# Patient Record
Sex: Male | Born: 1960 | Race: White | Hispanic: No | Marital: Married | Smoking: Current every day smoker
Health system: Southern US, Community
[De-identification: ages and names within clinical notes are randomized; demographics above are authoritative.]

## PROBLEM LIST (undated history)

## (undated) DIAGNOSIS — I2510 Atherosclerotic heart disease of native coronary artery without angina pectoris: Secondary | ICD-10-CM

## (undated) DIAGNOSIS — I219 Acute myocardial infarction, unspecified: Secondary | ICD-10-CM

## (undated) DIAGNOSIS — C679 Malignant neoplasm of bladder, unspecified: Secondary | ICD-10-CM

## (undated) DIAGNOSIS — I10 Essential (primary) hypertension: Secondary | ICD-10-CM

## (undated) DIAGNOSIS — C419 Malignant neoplasm of bone and articular cartilage, unspecified: Secondary | ICD-10-CM

## (undated) DIAGNOSIS — C3490 Malignant neoplasm of unspecified part of unspecified bronchus or lung: Secondary | ICD-10-CM

## (undated) HISTORY — PX: OTHER SURGICAL HISTORY: SHX169

---
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
## 2007-10-10 DIAGNOSIS — I219 Acute myocardial infarction, unspecified: Secondary | ICD-10-CM

## 2007-10-10 HISTORY — DX: Acute myocardial infarction, unspecified: I21.9

## 2007-10-10 HISTORY — PX: CORONARY ARTERY BYPASS GRAFT: SHX141

## 2008-07-29 ENCOUNTER — Ambulatory Visit: Payer: Self-pay | Admitting: Cardiothoracic Surgery

## 2008-09-09 ENCOUNTER — Ambulatory Visit: Payer: Self-pay | Admitting: Thoracic Surgery (Cardiothoracic Vascular Surgery)

## 2014-12-11 ENCOUNTER — Other Ambulatory Visit: Payer: Self-pay | Admitting: Neurosurgery

## 2014-12-11 DIAGNOSIS — M5412 Radiculopathy, cervical region: Secondary | ICD-10-CM

## 2014-12-18 ENCOUNTER — Ambulatory Visit
Admission: RE | Admit: 2014-12-18 | Discharge: 2014-12-18 | Disposition: A | Discharge status: Home / Self Care | Payer: Medicare HMO | Source: Ambulatory Visit | Attending: Neurosurgery | Admitting: Neurosurgery

## 2014-12-18 DIAGNOSIS — M5412 Radiculopathy, cervical region: Secondary | ICD-10-CM

## 2014-12-18 MED ORDER — IOHEXOL 300 MG/ML  SOLN
10.0000 mL | Freq: Once | INTRAMUSCULAR | Status: AC | PRN
  Administered 2014-12-18: 10 mL via INTRATHECAL

## 2014-12-18 MED ORDER — ONDANSETRON HCL 4 MG/2ML IJ SOLN
4.0000 mg | Freq: Four times a day (QID) | INTRAMUSCULAR | Status: DC | PRN
  Administered 2014-12-18: 4 mg via INTRAVENOUS

## 2014-12-18 NOTE — Discharge Instructions (Signed)

## 2014-12-18 NOTE — Progress Notes (Addendum)
Discharge instructions explained to pt.  Pt took own Xanex and valium was not given.

## 2015-02-22 ENCOUNTER — Other Ambulatory Visit: Payer: Self-pay | Admitting: Cardiothoracic Surgery

## 2015-02-22 DIAGNOSIS — R0789 Other chest pain: Secondary | ICD-10-CM

## 2015-02-23 ENCOUNTER — Encounter: Payer: Self-pay | Admitting: Cardiothoracic Surgery

## 2015-02-23 ENCOUNTER — Ambulatory Visit (INDEPENDENT_AMBULATORY_CARE_PROVIDER_SITE_OTHER): Payer: Medicare HMO | Admitting: Cardiothoracic Surgery

## 2015-02-23 ENCOUNTER — Other Ambulatory Visit: Payer: Self-pay | Admitting: *Deleted

## 2015-02-23 ENCOUNTER — Ambulatory Visit
Admission: RE | Admit: 2015-02-23 | Discharge: 2015-02-23 | Disposition: A | Discharge status: Home / Self Care | Payer: Medicare HMO | Source: Ambulatory Visit | Attending: Cardiothoracic Surgery | Admitting: Cardiothoracic Surgery

## 2015-02-23 VITALS — BP 108/69 | HR 87 | Resp 16 | Ht 68.0 in | Wt 117.0 lb

## 2015-02-23 DIAGNOSIS — R0789 Other chest pain: Secondary | ICD-10-CM

## 2015-02-23 DIAGNOSIS — R079 Chest pain, unspecified: Secondary | ICD-10-CM | POA: Diagnosis not present

## 2015-02-23 DIAGNOSIS — Z951 Presence of aortocoronary bypass graft: Secondary | ICD-10-CM | POA: Diagnosis not present

## 2015-02-23 DIAGNOSIS — J984 Other disorders of lung: Secondary | ICD-10-CM | POA: Diagnosis not present

## 2015-02-23 DIAGNOSIS — R918 Other nonspecific abnormal finding of lung field: Secondary | ICD-10-CM | POA: Insufficient documentation

## 2015-02-23 NOTE — Progress Notes (Signed)
[address]       [place] [address]             [phone]                    Sherrill Sparr [place] Medical Record #[id number] Date of Birth: Apr 01, 1961  Referring: Patient was self-referral Primary Care: No primary care provider on file.  Chief Complaint:    Chief Complaint  Patient presents with  . Routine Post Op    s/p CABG 2009.Marland KitchenMarland KitchenC/O STERNAL DISCOMFORT SINCE SURGERY...CXR    History of Present Illness:    Gilbert Williams 54 y.o. male is seen in the office  today , in 2009 and did coronary artery bypass grafting on the patient while he was an inpatient in [facility]. The patient has continued to have follow-up in [facility]. He called the office and noted that he had sternal discomfort since bypass and wish to be seen. Incentive the patient it appears that his story is more complicated, he's had increased cough over the past month, he has family notes increasing hoarseness over the past month. 20-25 pound weight loss over the last 3 months. Recently been worked up with left shoulder film and neck films and myelogram for neck and back pain.   Chest x-ray done in the office today shows large left hilar mass      Current Activity/ Functional Status:  Patient is independent with mobility/ambulation, transfers, ADL's, IADL's.   Zubrod Score: At the time of surgery this patient's most appropriate activity status/level should be described as: '[]'$     0    Normal activity, no symptoms '[]'$     1    Restricted in physical strenuous activity but ambulatory, able to do out light work '[x]'$     2    Ambulatory and capable of self care, unable to do work activities, up and about               >50 % of waking hours                              '[]'$     3    Only limited self care, in bed greater than 50% of waking hours '[]'$     4    Completely disabled, no self care, confined to bed or chair '[]'$     5    Moribund  Past Medical Hx Patient gives 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 the patient thinks that he also had radiation and chemotherapy but the details are very vague   Past surgical history: 07/29/2008 the patient had emergency coronary artery bypass grafting after an evolving acute myocardial infarction cardiac arrest with a ruptured plaque in the left main coronary artery with intra-aortic balloon pump in place at that time he underwent coronary artery bypass grafting with left internal mammary to left anterior descending coronary artery sequential reverse saphenous vein graft to the intermediate and distal circumflex sequential reverse saphenous vein graft to the posterior descending and posterior lateral branches of the right coronary artery at the time he was 54 year old male   Family history Patient notes his wife recently died at [facility] hospital during a colon resection He is very vague on family medical history and could not give me very many details  History   Social History  . Marital Status:  Married    Spouse Name: N/A  . Number of Children: N/A  . Years of Education: N/A   Occupational History  . Not on file.   Social History Main Topics  . Smoking status: Current Every Day Smoker -- 1.00 packs/day for 3 years    Types: Cigarettes  . Smokeless tobacco: Never Used  . Alcohol Use: No  . Drug Use: Not on file  . Sexual Activity: Not on file   Other Topics Concern  . Not on file   Social History Narrative  . No narrative on file    History  Smoking status  . Current Every Day Smoker -- 1.00 packs/day for 3 years  . Types: Cigarettes  Smokeless tobacco  . Never Used    History  Alcohol Use No     Allergies  Allergen Reactions  . Meperidine And Related Other (See Comments)    Happened with his amputation surgery,was told he had a bad reaction     Current Outpatient Prescriptions  Medication Sig Dispense Refill  . ALPRAZolam (XANAX) 0.25 MG tablet Take  0.25 mg by mouth at bedtime.    Marland Kitchen aspirin EC 81 MG tablet Take 81 mg by mouth daily.    . carvedilol (COREG) 6.25 MG tablet Take by mouth 2 (two) times daily with a meal.     . losartan (COZAAR) 50 MG tablet Take 50 mg by mouth daily.     No current facility-administered medications for this visit.      Review of Systems:     Cardiac Review of Systems: Y or N  Chest Pain [ y   ]  Resting SOB [  y ] Exertional SOB  Blue.Reese  ]  Orthopnea [ n ]   Pedal Edema [ n  ]    Palpitations [n  ] Syncope  [ n ]   Presyncope [ n  ]  General Review of Systems: [Y] = yes [  ]=no Constitional: recent weight change [ 20 lbs ];  Wt loss over the last 3 months [  Y ] anorexia [Y  ]; fatigue [Y  ]; nausea [  Y]; night sweats [  Y]; fever [Y  ]; or chills [  ];          Dental: poor dentition[  ]; Last Dentist visit:   Eye : blurred vision [  ]; diplopia [   ]; vision changes [  ];  Amaurosis fugax[  ]; Resp: cough [Y  ];  wheezing[Y  ];  hemoptysis[ N ]; shortness of breath[ Y ]; paroxysmal nocturnal dyspnea[  ]; dyspnea on exertion[Y  ]; or orthopnea[  ];  GI:  gallstones[  ], vomiting[  ];  dysphagia[  ]; melena[  ];  hematochezia [  ]; heartburn[  ];   Hx of  Colonoscopy[  ]; GU: kidney stones [  ]; hematuria[  ];   dysuria [  ];  nocturia[  ];  history of     obstruction [  ]; urinary frequency [  ]             Skin: rash, swelling[  ];, hair loss[  ];  peripheral edema[  ];  or itching[  ]; Musculosketetal: myalgias[  ];  joint swelling[  ];  joint erythema[  ];  joint pain[  ];  back pain[ Y ];  Heme/Lymph: bruising[  ];  bleeding[  ];  anemia[  ];  Neuro: TIA[N  ];  headaches[  ];  stroke[  ];  vertigo[  ];  seizures[  ];   paresthesias[  ];  difficulty walking[ WALKS WITH CRUTCHES FROM rightAKA  ];  Psych:depression[  ]; anxiety[  ];  Endocrine: diabetes[ N ];  thyroid dysfunction[ N ];  Immunizations: Flu up to date [  N]; Pneumococcal up to date [ N ];  Other:  Physical Exam: BP 108/69 mmHg  Pulse  87  Resp 16  Ht '5\' 8"'$  (1.727 m)  Wt 117 lb (53.071 kg)  BMI 17.79 kg/m2  SpO2 97%  PHYSICAL EXAMINATION: General appearance: alert, cooperative, appears older than stated age, cachectic, fatigued and no distress Head: Normocephalic, without obvious abnormality, atraumatic Neck: no adenopathy, no carotid bruit, no JVD, supple, symmetrical, trachea midline and thyroid not enlarged, symmetric, no tenderness/mass/nodules Lymph nodes: Cervical, supraclavicular, and axillary nodes normal. Resp: diminished breath sounds bibasilar Back: symmetric, no curvature. ROM normal. No CVA tenderness. Cardio: regular rate and rhythm, S1, S2 normal, no murmur, click, rub or gallop GI: soft, non-tender; bowel sounds normal; no masses,  no organomegaly Extremities: AMPUTATION AKA right Neurologic: Grossly normal  Diagnostic Studies & Laboratory data:     Recent Radiology Findings:   Dg Chest 2 View  02/23/2015   CLINICAL DATA:  Sternal pain.  EXAM: CHEST  2 VIEW  COMPARISON:  None.  FINDINGS: Prior CABG. Heart size normal. Pulmonary vascularity normal. Large left suprahilar mass lesion noted with left upper lobe atelectatic changes and pleural thickening. No pleural effusion or pneumothorax. No acute bony abnormality.  IMPRESSION: 1. Large left hilar/ suprahilar mass lesion with left upper lobe atelectasis and pleural thickening. Pulmonary malignancy could present in this fashion. PET-CT should be considered for further evaluation.  2. Prior CABG. Heart size normal. Sternal wires intact. No acute bony abnormality.   Electronically Signed   By: Marcello Moores  Register   On: 02/23/2015 14:21    I have independently reviewed the above radiology studies  and reviewed the findings with the patient.    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 We will arrange to obtain a PET scan as soon as possible to help stage and further delineate the abnormal chest x-ray. If confirmed the patient require tissue diagnosis and referral to multi disc for a thoracic oncology clinic.      I  spent 40 minutes counseling the patient face to face and 50% or more the  time was spent in counseling and coordination of care. The total time spent in the appointment was 60 minutes.  Grace Isaac MD      [place] [place] [address] Office [phone]   Beeper [phone]  02/23/2015 3:34 PM

## 2015-03-01 ENCOUNTER — Ambulatory Visit (HOSPITAL_COMMUNITY): Payer: Medicare HMO

## 2015-03-02 ENCOUNTER — Other Ambulatory Visit: Payer: Self-pay | Admitting: *Deleted

## 2015-03-02 DIAGNOSIS — R918 Other nonspecific abnormal finding of lung field: Secondary | ICD-10-CM

## 2015-03-03 ENCOUNTER — Encounter: Payer: Self-pay | Admitting: Cardiothoracic Surgery

## 2015-03-03 ENCOUNTER — Other Ambulatory Visit: Payer: Medicare HMO

## 2015-03-04 ENCOUNTER — Ambulatory Visit (HOSPITAL_COMMUNITY): Payer: Medicare HMO

## 2015-03-04 ENCOUNTER — Encounter: Payer: Self-pay | Admitting: Cardiothoracic Surgery

## 2015-03-04 ENCOUNTER — Ambulatory Visit
Admission: RE | Admit: 2015-03-04 | Discharge: 2015-03-04 | Disposition: A | Discharge status: Home / Self Care | Payer: Medicare HMO | Source: Ambulatory Visit | Attending: Cardiothoracic Surgery | Admitting: Cardiothoracic Surgery

## 2015-03-04 ENCOUNTER — Ambulatory Visit (INDEPENDENT_AMBULATORY_CARE_PROVIDER_SITE_OTHER): Payer: Medicare HMO | Admitting: Cardiothoracic Surgery

## 2015-03-04 VITALS — BP 90/63 | HR 104 | Resp 20 | Ht 68.0 in | Wt 117.0 lb

## 2015-03-04 DIAGNOSIS — Z85830 Personal history of malignant neoplasm of bone: Secondary | ICD-10-CM

## 2015-03-04 DIAGNOSIS — R918 Other nonspecific abnormal finding of lung field: Secondary | ICD-10-CM

## 2015-03-04 DIAGNOSIS — R222 Localized swelling, mass and lump, trunk: Secondary | ICD-10-CM

## 2015-03-04 MED ORDER — TRAMADOL HCL 50 MG PO TABS: 50.0000 mg | ORAL_TABLET | Freq: Four times a day (QID) | ORAL | Status: AC | PRN

## 2015-03-04 NOTE — Progress Notes (Signed)
[name] [address]             [phone]                    Alazar Zumwalt [place] Medical Record #[id number] Date of Birth: 05-07-1961  Referring: Patient was self-referral Primary Care: COUSINS, [name], [name]  Chief Complaint:    Chief Complaint  Patient presents with  . Mediastinal Mass    f/u after Chest CT, PET Scan pending 03/11/15    History of Present Illness:    Gilbert Williams 54 y.o. male is seen in the office  today , in 2009 I  did coronary artery bypass grafting on the patient while he was an inpatient in [facility]. The patient has continued to have follow-up in [facility]. He called the officehere  and noted that he had sternal discomfort since bypass and wish to be seen. In seeing  the patient it appears that his story is more complicated, he's had increased cough over the past month, he has family notes increasing hoarseness over the past month. 20-25 pound weight loss over the last 3 months. Recently been worked up with left shoulder film and neck films and myelogram for neck and back pain.  The patient was sent for a PET scan to evaluate obvious lung cancer on chest x-ray, this was denied because he had not previously had a CT scan of the chest. He comes in today with a CT scan of the chest completed and PET scan scheduled  He continues to have left anterior chest pain and left shoulder pain radiating to the neck. He's had no seizures or or other central neurologic symptoms   Current Activity/ Functional Status:  Patient is independent with mobility/ambulation, transfers, ADL's, IADL's.   Zubrod Score: At the time of surgery this patient's most appropriate activity status/level should be described as: '[]'$     0    Normal activity, no symptoms '[]'$     1    Restricted in physical strenuous activity but ambulatory, able to do out light work '[x]'$     2    Ambulatory and capable of self care, unable to do work activities, up  and about               >50 % of waking hours                              '[]'$     3    Only limited self care, in bed greater than 50% of waking hours '[]'$     4    Completely disabled, no self care, confined to bed or chair '[]'$     5    Moribund  Past Medical Hx Patient gives history of 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 the patient thinks that he also had radiation and chemotherapy but the details are very vague   Past surgical history: 07/29/2008 the patient had emergency coronary artery bypass grafting after an evolving acute myocardial infarction cardiac arrest with a ruptured plaque in the left main coronary artery with intra-aortic balloon pump in place at that time he underwent coronary artery bypass grafting with left internal mammary to left anterior descending coronary artery sequential reverse saphenous vein graft to the intermediate and distal circumflex sequential reverse saphenous vein graft to the posterior descending  and posterior lateral branches of the right coronary artery at the time he was 54 year old male   Family history Patient notes his wife recently died at [facility] during a colon resection He is very vague on family medical history and could not give me very many details  History   Social History  . Marital Status: Married    Spouse Name: N/A  . Number of Children: N/A  . Years of Education: N/A   Occupational History  . Not on file.   Social History Main Topics  . Smoking status: Current Every Day Smoker -- 1.00 packs/day for 3 years    Types: Cigarettes    Start date: 02/23/1979  . Smokeless tobacco: Never Used  . Alcohol Use: No  . Drug Use: Not on file  . Sexual Activity: Not on file   Other Topics Concern  . Not on file   Social History Narrative    History  Smoking status  . Current Every Day Smoker -- 1.00 packs/day for 3 years  . Types: Cigarettes  . Start date: 02/23/1979  Smokeless  tobacco  . Never Used    History  Alcohol Use No     Allergies  Allergen Reactions  . Meperidine And Related Other (See Comments)    Happened with his amputation surgery,was told he had a bad reaction     Current Outpatient Prescriptions  Medication Sig Dispense Refill  . ALPRAZolam (XANAX) 0.25 MG tablet Take 0.25 mg by mouth at bedtime.    Marland Kitchen aspirin EC 81 MG tablet Take 81 mg by mouth daily.    . carvedilol (COREG) 6.25 MG tablet Take by mouth 2 (two) times daily with a meal.     . losartan (COZAAR) 50 MG tablet Take 50 mg by mouth daily.    . traMADol (ULTRAM) 50 MG tablet Take 1 tablet (50 mg total) by mouth every 6 (six) hours as needed for moderate pain. 30 tablet 2   No current facility-administered medications for this visit.      Review of Systems:     Cardiac Review of Systems: Y or N  Chest Pain [ y   ]  Resting SOB [  y ] Exertional SOB  Blue.Reese  ]  Orthopnea [ n ]   Pedal Edema [ n  ]    Palpitations [n  ] Syncope  [ n ]   Presyncope [ n  ]  General Review of Systems: [Y] = yes [  ]=no Constitional: recent weight change [ 20 lbs ];  Wt loss over the last 3 months [  Y ] anorexia [Y  ]; fatigue [Y  ]; nausea [  Y]; night sweats [  Y]; fever [Y  ]; or chills [  ];          Dental: poor dentition[  ]; Last Dentist visit:   Eye : blurred vision [  ]; diplopia [   ]; vision changes [  ];  Amaurosis fugax[  ]; Resp: cough [Y  ];  wheezing[Y  ];  hemoptysis[ N ]; shortness of breath[ Y ]; paroxysmal nocturnal dyspnea[  ]; dyspnea on exertion[Y  ]; or orthopnea[  ];  GI:  gallstones[  ], vomiting[  ];  dysphagia[  ]; melena[  ];  hematochezia [  ]; heartburn[  ];   Hx of  Colonoscopy[  ]; GU: kidney stones [  ]; hematuria[  ];   dysuria [  ];  nocturia[  ];  history of     obstruction [  ]; urinary frequency [  ]             Skin: rash, swelling[  ];, hair loss[  ];  peripheral edema[  ];  or itching[  ]; Musculosketetal: myalgias[  ];  joint swelling[  ];  joint erythema[   ];  joint pain[  ];  back pain[ Y ];  Heme/Lymph: bruising[  ];  bleeding[  ];  anemia[  ];  Neuro: Sharlene.Ates  ];  headaches[  ];  stroke[  ];  vertigo[  ];  seizures[  ];   paresthesias[  ];  difficulty walking[ WALKS WITH CRUTCHES FROM rightAKA  ];  Psych:depression[  ]; anxiety[  ];  Endocrine: diabetes[ N ];  thyroid dysfunction[ N ];  Immunizations: Flu up to date [  N]; Pneumococcal up to date [ N ];  Other:  Physical Exam: BP 90/63 mmHg  Pulse 104  Resp 20  Ht '5\' 8"'$  (1.727 m)  Wt 117 lb (53.071 kg)  BMI 17.79 kg/m2  SpO2 96%  PHYSICAL EXAMINATION: General appearance: alert, cooperative, appears older than stated age, cachectic, fatigued and no distress Head: Normocephalic, without obvious abnormality, atraumatic Neck: no adenopathy, no carotid bruit, no JVD, supple, symmetrical, trachea midline and thyroid not enlarged, symmetric, no tenderness/mass/nodules Lymph nodes: Cervical, supraclavicular, and axillary nodes normal. Resp: diminished breath sounds bibasilar Back: symmetric, no curvature. ROM normal. No CVA tenderness. Cardio: regular rate and rhythm, S1, S2 normal, no murmur, click, rub or gallop GI: soft, non-tender; bowel sounds normal; no masses,  no organomegaly Extremities: AMPUTATION AKA right Neurologic: Grossly normal  Diagnostic Studies & Laboratory data:     Recent Radiology Findings:    Dg Chest 2 View  02/23/2015   CLINICAL DATA:  Sternal pain.  EXAM: CHEST  2 VIEW  COMPARISON:  None.  FINDINGS: Prior CABG. Heart size normal. Pulmonary vascularity normal. Large left suprahilar mass lesion noted with left upper lobe atelectatic changes and pleural thickening. No pleural effusion or pneumothorax. No acute bony abnormality.  IMPRESSION: 1. Large left hilar/ suprahilar mass lesion with left upper lobe atelectasis and pleural thickening. Pulmonary malignancy could present in this fashion. PET-CT should be considered for further evaluation.  2. Prior CABG. Heart size  normal. Sternal wires intact. No acute bony abnormality.   Electronically Signed   By: Marcello Moores  Register   On: 02/23/2015 14:21   Ct Chest Wo Contrast  03/04/2015   CLINICAL DATA:  Left suprahilar mass on chest x-ray. Mid chest pain. Smoker. Remote history of right lower extremity bone cancer.  EXAM: CT CHEST WITHOUT CONTRAST  TECHNIQUE: Multidetector CT imaging of the chest was performed following the standard protocol without IV contrast.  COMPARISON:  Chest radiograph of 02/23/2015. Remote chest CT of 08/11/2003.  FINDINGS: Mediastinum/Nodes: Mild bilateral gynecomastia. Aortic and branch vessel atherosclerosis. Prior median sternotomy. Mild motion degradation. Mild cardiomegaly. Multivessel coronary artery atherosclerosis.  11 mm AP window node on image 26 is suspicious. A precarinal node measures 1.0 cm on image 27. No dominant right hilar adenopathy.  Lungs/Pleura: Small left pleural effusion. Moderate centrilobular emphysema. A Mass replaces the left upper lobe, measuring on the order of 9.5 x 8.6 cm on image 26 of series 6. 10.6 cm craniocaudal. Direct extension to the left hilum and left side of the mediastinum. Example on image 26 of series 3 into the left side of the mediastinum. Left upper lobe in obstruction with significant mass effect on  the lingular and left lower lobe bronchi. No gross osseous destruction to confirm chest wall invasion.  Satellite parenchymal or pleural-based nodules including at 1.8 and 1.7 cm anteriorly and inferiorly on image 37 of series 3.  Upper abdomen: Motion degradation continuing. Marked right renal atrophy. Grossly normal imaged portions the liver, spleen, stomach, pancreas, adrenal glands. Retroaortic left renal vein.  Musculoskeletal: No acute osseous abnormality.  IMPRESSION: 1. Mass replacing the majority of the left upper lobe with direct mediastinal extension, consistent with primary bronchogenic carcinoma. 2. Enlarged mediastinal nodes are highly suspicious for  metastatic disease. Pulmonary parenchymal or pleural based satellite nodules within the inferior left hemi thorax are also malignant. 3. Small left pleural effusion. 4. Degraded exam secondary to motion and lack of IV contrast. 5. Advanced atherosclerosis with centrilobular emphysema. 6. Gynecomastia.   Electronically Signed   By: Abigail Miyamoto M.D.   On: 03/04/2015 15:17      I have independently reviewed the above radiology studies  and reviewed the findings with the patient.    Recent Lab Findings: No results found for: WBC, HGB, HCT, PLT, GLUCOSE, CHOL, TRIG, HDL, LDLDIRECT, LDLCALC, ALT, AST, NA, K, CL, CREATININE, BUN, CO2, TSH, INR, GLUF, HGBA1C    Assessment / Plan:   Suspect advanced stage carcinoma the lung left hilum with associated slight elevation of the left hemidiaphragm and hoarseness- with mediastinal and chest wall invasion I discussed and reviewed the films with the patient. We will arrange to obtain a PET scan as soon as possible to help stage and further delineate the abnormal chest x-ray. With CT evidence of obvious advanced stage lung cancer MRI of the brain to rule out brain metastasis will be obtained. I discussed with the patient today proceeding with CT-guided needle biopsy of the large left chest mass to obtain a tissue diagnosis as quickly as possible and then proceed with consultation with thoracic oncology and radiation oncology. To help control the chest wall pain until biopsy and radiation treatment can be started the patient was given a prescription for Ultram 50 mg by mouth every 6 hours when necessary for pain.   Grace Isaac MD      [address] [place] [place] [address] Office [phone]   Beeper [phone]  03/04/2015 5:59 PM

## 2015-03-05 ENCOUNTER — Other Ambulatory Visit: Payer: Self-pay

## 2015-03-05 ENCOUNTER — Telehealth: Payer: Self-pay | Admitting: *Deleted

## 2015-03-05 ENCOUNTER — Ambulatory Visit: Payer: Medicare HMO | Admitting: Cardiothoracic Surgery

## 2015-03-05 ENCOUNTER — Encounter: Payer: Self-pay | Admitting: *Deleted

## 2015-03-05 ENCOUNTER — Other Ambulatory Visit: Payer: Self-pay | Admitting: *Deleted

## 2015-03-05 DIAGNOSIS — R918 Other nonspecific abnormal finding of lung field: Secondary | ICD-10-CM

## 2015-03-05 DIAGNOSIS — R42 Dizziness and giddiness: Secondary | ICD-10-CM

## 2015-03-05 DIAGNOSIS — R222 Localized swelling, mass and lump, trunk: Secondary | ICD-10-CM

## 2015-03-05 NOTE — Telephone Encounter (Signed)
Oncology Nurse Navigator Documentation  Oncology Nurse Navigator Flowsheets 03/05/2015  Referral date to RadOnc/MedOnc 03/05/2015  Navigator Encounter Type Introductory phone call.  Received a referral from Dr. Servando Snare.  Called to arrange appt for [facility] clinic.  Patient verbalized understanding of appt time and place.    Barriers/Navigation Needs No barriers at this time  Time Spent with Patient 15

## 2015-03-09 ENCOUNTER — Telehealth: Payer: Self-pay | Admitting: Internal Medicine

## 2015-03-09 ENCOUNTER — Other Ambulatory Visit: Payer: Self-pay | Admitting: Radiology

## 2015-03-09 ENCOUNTER — Other Ambulatory Visit: Payer: Medicare HMO

## 2015-03-09 NOTE — Telephone Encounter (Signed)
Returned Advertising account executive. Patient confirm lab 06/02.

## 2015-03-10 ENCOUNTER — Encounter (HOSPITAL_COMMUNITY): Payer: Self-pay

## 2015-03-10 ENCOUNTER — Telehealth: Payer: Self-pay | Admitting: *Deleted

## 2015-03-10 ENCOUNTER — Ambulatory Visit (HOSPITAL_COMMUNITY)
Admission: RE | Admit: 2015-03-10 | Discharge: 2015-03-10 | Disposition: A | Discharge status: Home / Self Care | Payer: Medicare HMO | Source: Ambulatory Visit | Attending: Interventional Radiology | Admitting: Interventional Radiology

## 2015-03-10 ENCOUNTER — Ambulatory Visit (HOSPITAL_COMMUNITY)
Admission: RE | Admit: 2015-03-10 | Discharge: 2015-03-10 | Disposition: A | Discharge status: Home / Self Care | Payer: Medicare HMO | Source: Ambulatory Visit | Attending: Cardiothoracic Surgery | Admitting: Cardiothoracic Surgery

## 2015-03-10 DIAGNOSIS — Z9889 Other specified postprocedural states: Secondary | ICD-10-CM

## 2015-03-10 DIAGNOSIS — C3410 Malignant neoplasm of upper lobe, unspecified bronchus or lung: Secondary | ICD-10-CM | POA: Insufficient documentation

## 2015-03-10 DIAGNOSIS — R918 Other nonspecific abnormal finding of lung field: Secondary | ICD-10-CM | POA: Diagnosis present

## 2015-03-10 DIAGNOSIS — R222 Localized swelling, mass and lump, trunk: Secondary | ICD-10-CM

## 2015-03-10 HISTORY — DX: Atherosclerotic heart disease of native coronary artery without angina pectoris: I25.10

## 2015-03-10 HISTORY — DX: Essential (primary) hypertension: I10

## 2015-03-10 LAB — APTT: aPTT: 32 seconds (ref 24–37)

## 2015-03-10 LAB — CBC
HEMATOCRIT: 38.1 % — AB (ref 39.0–52.0)
Hemoglobin: 12.9 g/dL — ABNORMAL LOW (ref 13.0–17.0)
MCH: 28.4 pg (ref 26.0–34.0)
MCHC: 33.9 g/dL (ref 30.0–36.0)
MCV: 83.7 fL (ref 78.0–100.0)
PLATELETS: 464 10*3/uL — AB (ref 150–400)
RBC: 4.55 MIL/uL (ref 4.22–5.81)
RDW: 16 % — AB (ref 11.5–15.5)
WBC: 8.3 10*3/uL (ref 4.0–10.5)

## 2015-03-10 LAB — PROTIME-INR
INR: 1.11 (ref 0.00–1.49)
PROTHROMBIN TIME: 14.5 s (ref 11.6–15.2)

## 2015-03-10 MED ORDER — FENTANYL CITRATE (PF) 100 MCG/2ML IJ SOLN
INTRAMUSCULAR | Status: AC | PRN
  Administered 2015-03-10: 25 ug via INTRAVENOUS

## 2015-03-10 MED ORDER — FENTANYL CITRATE (PF) 100 MCG/2ML IJ SOLN
INTRAMUSCULAR | Status: AC
  Filled 2015-03-10: qty 2

## 2015-03-10 MED ORDER — LIDOCAINE HCL 1 % IJ SOLN
INTRAMUSCULAR | Status: AC
Start: 2015-03-10 — End: 2015-03-10
  Filled 2015-03-10: qty 20

## 2015-03-10 MED ORDER — MIDAZOLAM HCL 2 MG/2ML IJ SOLN
INTRAMUSCULAR | Status: AC
Start: 2015-03-10 — End: 2015-03-10
  Filled 2015-03-10: qty 2

## 2015-03-10 MED ORDER — SODIUM CHLORIDE 0.9 % IV SOLN
Freq: Once | INTRAVENOUS | Status: AC
  Administered 2015-03-10: 12:00:00 via INTRAVENOUS

## 2015-03-10 MED ORDER — MIDAZOLAM HCL 2 MG/2ML IJ SOLN
INTRAMUSCULAR | Status: AC | PRN
  Administered 2015-03-10: 0.5 mg via INTRAVENOUS
  Administered 2015-03-10: 1 mg via INTRAVENOUS

## 2015-03-10 MED ORDER — SODIUM CHLORIDE 0.9 % IV SOLN: Freq: Once | INTRAVENOUS | Status: DC

## 2015-03-10 NOTE — H&P (Signed)
Chief Complaint: Sternal pain Wt loss New L chest wall mass  Referring Physician(s): Gerhardt,Edward B  History of Present Illness: Gilbert Williams is a 54 y.o. 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 Coronary artery disease   . Hypertension     Past Surgical History  Procedure Laterality Date  . Coronary artery bypass graft    . Leg amputation above knee Right 1981    Allergies: Meperidine and related  Medications: Prior to Admission medications   Medication Sig Start Date End Date Taking? Authorizing Provider  ALPRAZolam (XANAX) 0.25 MG tablet Take 0.25 mg by mouth at bedtime.   Yes Historical Provider, MD  aspirin EC 81 MG tablet Take 81 mg by mouth daily.   Yes Historical Provider, MD  carvedilol (COREG) 6.25 MG tablet Take 6.25 mg by mouth 2 (two) times daily with a meal.    Yes Historical Provider, MD  losartan (COZAAR) 50 MG tablet Take 50 mg by mouth daily.   Yes Historical Provider, MD  traMADol (ULTRAM) 50 MG tablet Take 1 tablet (50 mg total) by mouth every 6 (six) hours as needed for moderate pain. 03/04/15  Yes Grace Isaac, MD     History reviewed. No pertinent family history.  History   Social History  . Marital Status: Married    Spouse Name: N/A  . Number of Children: N/A  . Years of Education: N/A   Social History Main Topics  . Smoking status: Current Every Day Smoker -- 1.00 packs/day for 3 years    Types: Cigarettes    Start date: 02/23/1979  . Smokeless tobacco: Never Used  . Alcohol Use: No  . Drug Use: Not on file  . Sexual Activity: Not on file   Other Topics Concern  . None   Social History Narrative    Review of Systems: A 12 point ROS discussed and pertinent positives are indicated in the HPI above.  All other systems are  negative.  Review of Systems  Constitutional: Positive for activity change, appetite change and unexpected weight change. Negative for fever and fatigue.  HENT: Positive for sore throat and voice change.   Respiratory: Positive for cough. Negative for shortness of breath.   Cardiovascular: Positive for chest pain.  Neurological: Positive for weakness.  Psychiatric/Behavioral: Negative for confusion and decreased concentration.    Vital Signs: BP 99/62 mmHg  Pulse 97  Temp(Src) 97.7 F (36.5 C) (Oral)  Ht '5\' 8"'$  (1.727 m)  Wt 53.071 kg (117 lb)  BMI 17.79 kg/m2  Physical Exam  Constitutional: He is oriented to person, place, and time. He appears well-developed.  Cardiovascular: Normal rate, regular rhythm and normal heart sounds.   No murmur heard. Pulmonary/Chest: Effort normal. He has wheezes.  Abdominal: Soft. Bowel sounds are normal. There is no tenderness.  Musculoskeletal: Normal range of motion.  R AKA  Neurological: He is alert and oriented to person, place, and time.  Skin: Skin is warm and dry.  Psychiatric: He has a normal mood and affect. His behavior is normal. Judgment and thought content normal.  Nursing note and vitals reviewed.   Mallampati Score:  MD Evaluation Airway: WNL Heart: WNL Abdomen: WNL Chest/ Lungs: WNL ASA  Classification: 3 Mallampati/Airway Score: One  Imaging: Dg Chest  2 View  02/23/2015   CLINICAL DATA:  Sternal pain.  EXAM: CHEST  2 VIEW  COMPARISON:  None.  FINDINGS: Prior CABG. Heart size normal. Pulmonary vascularity normal. Large left suprahilar mass lesion noted with left upper lobe atelectatic changes and pleural thickening. No pleural effusion or pneumothorax. No acute bony abnormality.  IMPRESSION: 1. Large left hilar/ suprahilar mass lesion with left upper lobe atelectasis and pleural thickening. Pulmonary malignancy could present in this fashion. PET-CT should be considered for further evaluation.  2. Prior CABG. Heart size  normal. Sternal wires intact. No acute bony abnormality.   Electronically Signed   By: Marcello Moores  Register   On: 02/23/2015 14:21   Ct Chest Wo Contrast  03/04/2015   CLINICAL DATA:  Left suprahilar mass on chest x-ray. Mid chest pain. Smoker. Remote history of right lower extremity bone cancer.  EXAM: CT CHEST WITHOUT CONTRAST  TECHNIQUE: Multidetector CT imaging of the chest was performed following the standard protocol without IV contrast.  COMPARISON:  Chest radiograph of 02/23/2015. Remote chest CT of 08/11/2003.  FINDINGS: Mediastinum/Nodes: Mild bilateral gynecomastia. Aortic and branch vessel atherosclerosis. Prior median sternotomy. Mild motion degradation. Mild cardiomegaly. Multivessel coronary artery atherosclerosis.  11 mm AP window node on image 26 is suspicious. A precarinal node measures 1.0 cm on image 27. No dominant right hilar adenopathy.  Lungs/Pleura: Small left pleural effusion. Moderate centrilobular emphysema. A Mass replaces the left upper lobe, measuring on the order of 9.5 x 8.6 cm on image 26 of series 6. 10.6 cm craniocaudal. Direct extension to the left hilum and left side of the mediastinum. Example on image 26 of series 3 into the left side of the mediastinum. Left upper lobe in obstruction with significant mass effect on the lingular and left lower lobe bronchi. No gross osseous destruction to confirm chest wall invasion.  Satellite parenchymal or pleural-based nodules including at 1.8 and 1.7 cm anteriorly and inferiorly on image 37 of series 3.  Upper abdomen: Motion degradation continuing. Marked right renal atrophy. Grossly normal imaged portions the liver, spleen, stomach, pancreas, adrenal glands. Retroaortic left renal vein.  Musculoskeletal: No acute osseous abnormality.  IMPRESSION: 1. Mass replacing the majority of the left upper lobe with direct mediastinal extension, consistent with primary bronchogenic carcinoma. 2. Enlarged mediastinal nodes are highly suspicious for  metastatic disease. Pulmonary parenchymal or pleural based satellite nodules within the inferior left hemi thorax are also malignant. 3. Small left pleural effusion. 4. Degraded exam secondary to motion and lack of IV contrast. 5. Advanced atherosclerosis with centrilobular emphysema. 6. Gynecomastia.   Electronically Signed   By: Abigail Miyamoto M.D.   On: 03/04/2015 15:17    Labs:  CBC:  Recent Labs  03/10/15 0930  WBC 8.3  HGB 12.9*  HCT 38.1*  PLT 464*    COAGS:  Recent Labs  03/10/15 0930  INR 1.11  APTT 32    BMP: No results for input(s): NA, K, CL, CO2, GLUCOSE, BUN, CALCIUM, CREATININE, GFRNONAA, GFRAA in the last 8760 hours.  Invalid input(s): CMP  LIVER FUNCTION TESTS: No results for input(s): BILITOT, AST, ALT, ALKPHOS, PROT, ALBUMIN in the last 8760 hours.  TUMOR MARKERS: No results for input(s): AFPTM, CEA, CA199, CHROMGRNA in the last 8760 hours.  Assessment and Plan:  Sternal pain; wt loss Smoker CT 02/2015 new L chest wall mass Now scheduled for bx of same Risks and Benefits discussed with the patient including, but not limited to bleeding, infection, damage to adjacent structures or  low yield requiring additional tests. All of the patient's questions were answered, patient is agreeable to proceed. Consent signed and in chart.    Thank you for this interesting consult.  I greatly enjoyed meeting Gilbert Williams and look forward to participating in their care.  Signed: Kadence Mikkelson A 03/10/2015, 10:43 AM   I spent a total of  20 Minutes   in face to face in clinical consultation, greater than 50% of which was counseling/coordinating care for L chest wall mass bx

## 2015-03-10 NOTE — Sedation Documentation (Signed)
Patient is resting comfortably. 

## 2015-03-10 NOTE — Discharge Instructions (Signed)
Needle Biopsy of Lung, Care After °Refer to this sheet in the next few weeks. These instructions provide you with information on caring for yourself after your procedure. Your health care provider may also give you more specific instructions. Your treatment has been planned according to current medical practices, but problems sometimes occur. Call your health care provider if you have any problems or questions after your procedure. °WHAT TO EXPECT AFTER THE PROCEDURE °· A bandage will be applied over the area where the needle was inserted. You may be asked to apply pressure to the bandage for several minutes to ensure there is minimal bleeding. °· In most cases, you can leave when your needle biopsy procedure is completed. Do not drive yourself home. Someone else should take you home. °· If you received an IV sedative or general anesthetic, you will be taken to a comfortable place to relax while the medicine wears off. °· If you have upcoming travel scheduled, talk to your health care provider about when it is safe to travel by air after the procedure. °HOME CARE INSTRUCTIONS °· Expect to take it easy for the rest of the day. °· Protect the area where you received the needle biopsy by keeping the bandage in place for as long as instructed. °· You may feel some mild pain or discomfort in the area, but this should stop in a day or two. °· Take medicines only as directed by your health care provider. °SEEK MEDICAL CARE IF:  °· You have pain at the biopsy site that worsens or is not helped by medicine. °· You have swelling or drainage at the needle biopsy site. °· You have a fever. °SEEK IMMEDIATE MEDICAL CARE IF:  °· You have new or worsening shortness of breath. °· You have chest pain. °· You are coughing up blood. °· You have bleeding that does not stop with pressure or a bandage. °· You develop light-headedness or fainting. °Document Released: 07/23/2007 Document Revised: 02/09/2014 Document Reviewed:  02/17/2013 °Exit[employer]® Patient Information ©2015 [employer]. This information is not intended to replace advice given to you by your health care provider. Make sure you discuss any questions you have with your health care provider. ° °

## 2015-03-10 NOTE — Procedures (Signed)
LUL lung Bx Core No comp

## 2015-03-10 NOTE — Telephone Encounter (Signed)
Called and left a message w/ a friendly reminder about clinic appt for 6/2.  Left directions and instructions on voicemail.

## 2015-03-10 NOTE — Progress Notes (Signed)
Dr Barbie Banner returned page, pts xray showed no pneumothorax, pt may eat and drink

## 2015-03-11 ENCOUNTER — Ambulatory Visit: Payer: Medicare HMO | Admitting: Physical Therapy

## 2015-03-11 ENCOUNTER — Other Ambulatory Visit: Payer: Medicare HMO

## 2015-03-11 ENCOUNTER — Ambulatory Visit: Payer: Medicare HMO | Admitting: Internal Medicine

## 2015-03-11 ENCOUNTER — Other Ambulatory Visit: Payer: Self-pay | Admitting: Medical Oncology

## 2015-03-11 ENCOUNTER — Telehealth: Payer: Self-pay | Admitting: *Deleted

## 2015-03-11 ENCOUNTER — Ambulatory Visit
Admission: RE | Admit: 2015-03-11 | Discharge: 2015-03-11 | Disposition: A | Discharge status: Home / Self Care | Payer: Medicare HMO | Source: Ambulatory Visit | Attending: Radiation Oncology | Admitting: Radiation Oncology

## 2015-03-11 ENCOUNTER — Encounter (HOSPITAL_COMMUNITY): Payer: Medicare HMO

## 2015-03-11 DIAGNOSIS — C3412 Malignant neoplasm of upper lobe, left bronchus or lung: Secondary | ICD-10-CM

## 2015-03-11 NOTE — Telephone Encounter (Signed)
Oncology Nurse Navigator Documentation  Oncology Nurse Navigator Flowsheets 03/11/2015  Referral date to RadOnc/MedOnc   Navigator Encounter Type Other.  Patient was late to thoracic clinic today.  I called to check on patient.  He states he is going to the hospital to be admitted.  I notified Dr. Julien Nordmann.    Patient Visit Type Phone call   Barriers/Navigation Needs   Time Spent with Patient 15

## 2015-03-12 ENCOUNTER — Emergency Department (HOSPITAL_COMMUNITY): Payer: Medicare HMO

## 2015-03-12 ENCOUNTER — Observation Stay (HOSPITAL_COMMUNITY)
Admission: EM | Admit: 2015-03-12 | Discharge: 2015-03-13 | Disposition: A | Discharge status: Home / Self Care | Payer: Medicare HMO | Attending: Internal Medicine | Admitting: Internal Medicine

## 2015-03-12 ENCOUNTER — Encounter (HOSPITAL_COMMUNITY): Payer: Self-pay | Admitting: Emergency Medicine

## 2015-03-12 ENCOUNTER — Telehealth: Payer: Self-pay | Admitting: *Deleted

## 2015-03-12 DIAGNOSIS — Z8551 Personal history of malignant neoplasm of bladder: Secondary | ICD-10-CM | POA: Diagnosis not present

## 2015-03-12 DIAGNOSIS — Z951 Presence of aortocoronary bypass graft: Secondary | ICD-10-CM | POA: Insufficient documentation

## 2015-03-12 DIAGNOSIS — Z681 Body mass index (BMI) 19 or less, adult: Secondary | ICD-10-CM | POA: Insufficient documentation

## 2015-03-12 DIAGNOSIS — I25810 Atherosclerosis of coronary artery bypass graft(s) without angina pectoris: Secondary | ICD-10-CM | POA: Diagnosis not present

## 2015-03-12 DIAGNOSIS — Z801 Family history of malignant neoplasm of trachea, bronchus and lung: Secondary | ICD-10-CM | POA: Insufficient documentation

## 2015-03-12 DIAGNOSIS — I2510 Atherosclerotic heart disease of native coronary artery without angina pectoris: Secondary | ICD-10-CM | POA: Diagnosis present

## 2015-03-12 DIAGNOSIS — I10 Essential (primary) hypertension: Secondary | ICD-10-CM | POA: Insufficient documentation

## 2015-03-12 DIAGNOSIS — R9431 Abnormal electrocardiogram [ECG] [EKG]: Secondary | ICD-10-CM | POA: Diagnosis present

## 2015-03-12 DIAGNOSIS — R42 Dizziness and giddiness: Secondary | ICD-10-CM | POA: Diagnosis present

## 2015-03-12 DIAGNOSIS — E43 Unspecified severe protein-calorie malnutrition: Secondary | ICD-10-CM | POA: Diagnosis not present

## 2015-03-12 DIAGNOSIS — I252 Old myocardial infarction: Secondary | ICD-10-CM | POA: Diagnosis not present

## 2015-03-12 DIAGNOSIS — C3412 Malignant neoplasm of upper lobe, left bronchus or lung: Secondary | ICD-10-CM

## 2015-03-12 DIAGNOSIS — I959 Hypotension, unspecified: Principal | ICD-10-CM | POA: Diagnosis present

## 2015-03-12 DIAGNOSIS — Z720 Tobacco use: Secondary | ICD-10-CM | POA: Diagnosis present

## 2015-03-12 DIAGNOSIS — Z7982 Long term (current) use of aspirin: Secondary | ICD-10-CM | POA: Insufficient documentation

## 2015-03-12 DIAGNOSIS — F17210 Nicotine dependence, cigarettes, uncomplicated: Secondary | ICD-10-CM | POA: Insufficient documentation

## 2015-03-12 DIAGNOSIS — R531 Weakness: Secondary | ICD-10-CM

## 2015-03-12 DIAGNOSIS — Z85830 Personal history of malignant neoplasm of bone: Secondary | ICD-10-CM | POA: Diagnosis not present

## 2015-03-12 DIAGNOSIS — Z89611 Acquired absence of right leg above knee: Secondary | ICD-10-CM | POA: Diagnosis not present

## 2015-03-12 HISTORY — DX: Malignant neoplasm of bladder, unspecified: C67.9

## 2015-03-12 HISTORY — DX: Malignant neoplasm of unspecified part of unspecified bronchus or lung: C34.90

## 2015-03-12 HISTORY — DX: Acute myocardial infarction, unspecified: I21.9

## 2015-03-12 HISTORY — DX: Malignant neoplasm of bone and articular cartilage, unspecified: C41.9

## 2015-03-12 LAB — CBC WITH DIFFERENTIAL/PLATELET
BASOS ABS: 0.1 10*3/uL (ref 0.0–0.1)
Basophils Relative: 1 % (ref 0–1)
EOS ABS: 0.3 10*3/uL (ref 0.0–0.7)
Eosinophils Relative: 3 % (ref 0–5)
HEMATOCRIT: 36.7 % — AB (ref 39.0–52.0)
Hemoglobin: 11.8 g/dL — ABNORMAL LOW (ref 13.0–17.0)
Lymphocytes Relative: 17 % (ref 12–46)
Lymphs Abs: 1.6 10*3/uL (ref 0.7–4.0)
MCH: 27.8 pg (ref 26.0–34.0)
MCHC: 32.2 g/dL (ref 30.0–36.0)
MCV: 86.4 fL (ref 78.0–100.0)
MONO ABS: 0.7 10*3/uL (ref 0.1–1.0)
Monocytes Relative: 8 % (ref 3–12)
Neutro Abs: 6.6 10*3/uL (ref 1.7–7.7)
Neutrophils Relative %: 71 % (ref 43–77)
Platelets: 475 10*3/uL — ABNORMAL HIGH (ref 150–400)
RBC: 4.25 MIL/uL (ref 4.22–5.81)
RDW: 15.8 % — AB (ref 11.5–15.5)
WBC: 9.3 10*3/uL (ref 4.0–10.5)

## 2015-03-12 LAB — APTT: aPTT: 33 seconds (ref 24–37)

## 2015-03-12 LAB — URINALYSIS, ROUTINE W REFLEX MICROSCOPIC
Glucose, UA: NEGATIVE mg/dL
Hgb urine dipstick: NEGATIVE
Ketones, ur: 40 mg/dL — AB
Leukocytes, UA: NEGATIVE
Nitrite: NEGATIVE
Protein, ur: NEGATIVE mg/dL
Specific Gravity, Urine: 1.013 (ref 1.005–1.030)
UROBILINOGEN UA: 2 mg/dL — AB (ref 0.0–1.0)
pH: 6 (ref 5.0–8.0)

## 2015-03-12 LAB — COMPREHENSIVE METABOLIC PANEL
ALBUMIN: 3.3 g/dL — AB (ref 3.5–5.0)
ALT: 10 U/L — ABNORMAL LOW (ref 17–63)
ANION GAP: 13 (ref 5–15)
AST: 20 U/L (ref 15–41)
Alkaline Phosphatase: 112 U/L (ref 38–126)
BUN: 10 mg/dL (ref 6–20)
CHLORIDE: 95 mmol/L — AB (ref 101–111)
CO2: 24 mmol/L (ref 22–32)
CREATININE: 0.56 mg/dL — AB (ref 0.61–1.24)
Calcium: 9.1 mg/dL (ref 8.9–10.3)
GFR calc Af Amer: >60 mL/min (ref >60)
Glucose, Bld: 107 mg/dL — ABNORMAL HIGH (ref 65–99)
POTASSIUM: 4.4 mmol/L (ref 3.5–5.1)
SODIUM: 132 mmol/L — AB (ref 135–145)
Total Bilirubin: 0.7 mg/dL (ref 0.3–1.2)
Total Protein: 7.1 g/dL (ref 6.5–8.1)

## 2015-03-12 LAB — PROTIME-INR
INR: 1.15 (ref 0.00–1.49)
Prothrombin Time: 14.9 seconds (ref 11.6–15.2)

## 2015-03-12 LAB — I-STAT CG4 LACTIC ACID, ED: Lactic Acid, Venous: 1.79 mmol/L (ref 0.5–2.0)

## 2015-03-12 LAB — TROPONIN I
Troponin I: <0.03 ng/mL (ref <0.031)
Troponin I: <0.03 ng/mL (ref <0.031)

## 2015-03-12 MED ORDER — ACETAMINOPHEN 325 MG PO TABS: 650.0000 mg | ORAL_TABLET | Freq: Four times a day (QID) | ORAL | Status: DC | PRN

## 2015-03-12 MED ORDER — SODIUM CHLORIDE 0.9 % IV BOLUS (SEPSIS): 2000.0000 mL | Freq: Once | INTRAVENOUS | Status: DC

## 2015-03-12 MED ORDER — ALUM & MAG HYDROXIDE-SIMETH 200-200-20 MG/5ML PO SUSP: 30.0000 mL | Freq: Four times a day (QID) | ORAL | Status: DC | PRN

## 2015-03-12 MED ORDER — ENOXAPARIN SODIUM 40 MG/0.4ML ~~LOC~~ SOLN
40.0000 mg | SUBCUTANEOUS | Status: DC
  Filled 2015-03-12: qty 0.4

## 2015-03-12 MED ORDER — SODIUM CHLORIDE 0.9 % IV SOLN
INTRAVENOUS | Status: DC
  Administered 2015-03-12 – 2015-03-13 (×2): via INTRAVENOUS

## 2015-03-12 MED ORDER — ACETAMINOPHEN 650 MG RE SUPP: 650.0000 mg | Freq: Four times a day (QID) | RECTAL | Status: DC | PRN

## 2015-03-12 MED ORDER — SODIUM CHLORIDE 0.9 % IV BOLUS (SEPSIS)
1000.0000 mL | Freq: Once | INTRAVENOUS | Status: AC
  Administered 2015-03-12: 1000 mL via INTRAVENOUS

## 2015-03-12 MED ORDER — ONDANSETRON HCL 4 MG PO TABS: 4.0000 mg | ORAL_TABLET | Freq: Four times a day (QID) | ORAL | Status: DC | PRN

## 2015-03-12 MED ORDER — POLYETHYLENE GLYCOL 3350 17 G PO PACK: 17.0000 g | PACK | Freq: Every day | ORAL | Status: DC | PRN

## 2015-03-12 MED ORDER — ONDANSETRON HCL 4 MG/2ML IJ SOLN: 4.0000 mg | Freq: Four times a day (QID) | INTRAMUSCULAR | Status: DC | PRN

## 2015-03-12 MED ORDER — NICOTINE 21 MG/24HR TD PT24
21.0000 mg | MEDICATED_PATCH | Freq: Every day | TRANSDERMAL | Status: DC
  Administered 2015-03-12 – 2015-03-13 (×2): 21 mg via TRANSDERMAL
  Filled 2015-03-12 (×2): qty 1

## 2015-03-12 MED ORDER — OXYCODONE HCL 5 MG PO TABS: 5.0000 mg | ORAL_TABLET | ORAL | Status: DC | PRN

## 2015-03-12 MED ORDER — ALPRAZOLAM 0.25 MG PO TABS
0.2500 mg | ORAL_TABLET | Freq: Every day | ORAL | Status: DC
  Administered 2015-03-12: 0.25 mg via ORAL
  Filled 2015-03-12: qty 1

## 2015-03-12 MED ORDER — SODIUM CHLORIDE 0.9 % IJ SOLN: 3.0000 mL | Freq: Two times a day (BID) | INTRAMUSCULAR | Status: DC

## 2015-03-12 MED ORDER — ASPIRIN EC 81 MG PO TBEC
81.0000 mg | DELAYED_RELEASE_TABLET | Freq: Every day | ORAL | Status: DC
  Administered 2015-03-13: 81 mg via ORAL
  Filled 2015-03-12: qty 1

## 2015-03-12 NOTE — H&P (Signed)
History and Physical:    Gilbert Williams   [name] DOB: [birth year] DOA: 03/12/2015  Referring physician: Dr. Alvino Williams PCP: Gilbert Gerald, MD   Chief Complaint: Dizziness, chest pain, "I just don't feel good".  History of Present Illness:   Gilbert Williams is an 54 y.o. male with a PMH of bladder cancer x 2, bone cancer s/p right AKA, and recently diagnosed lung cancer who reports to the hospital with a chief complaint of dizziness, weakness and uncontrolled chest wall pain.  He was referred to see Dr. Julien Williams 03/17/15, but cancelled his appointment because he was too weak to go.  The chest pain is left sided, 8-9/10 at worst, says lying down eases the pain off, standing up exacerbates the pain. He has taken Ultram with some short term relief, but pain comes back before next dose is due.  He says he has lost over 25 lbs in the past 6 months.  Upon initial evaluation in the ED, he was found to be persistently hypotensive (on Coreg and Cozaar, which he took this morning) despite IVF and therefore was referred to [facility].  ROS:   Constitutional: No fever, no chills;  Appetite diminished; + weight loss, no weight gain, + fatigue.  HEENT: + blurry vision, no diplopia, no pharyngitis, no dysphagia CV: No chest pain, no palpitations, no PND, no orthopnea, no edema.  Resp: + SOB, no cough, + pleuritic pain. GI: No nausea, no vomiting, no diarrhea, no melena, no hematochezia, no constipation, no abdominal pain.  GU: No dysuria, no hematuria, no frequency, no urgency. MSK: no myalgias, no arthralgias.  Neuro:  No headache, no focal neurological deficits, no history of seizures.  Psych: No depression, no anxiety.  Endo: No heat intolerance, no cold intolerance, no polyuria, no polydipsia  Skin: No rashes, no skin lesions.  Heme: No easy bruising.  Travel history: No recent travel.   Past Medical History:   Past Medical History  Diagnosis Date  . Coronary artery disease   . Hypertension   . Bladder  cancer     x2  . Myocardial infarction 2009  . Lung cancer   . Bone cancer     Past Surgical History:   Past Surgical History  Procedure Laterality Date  . Coronary artery bypass graft  2009  . Leg amputation above knee Right 1981    Social History:   History   Social History  . Marital Status: Married    Spouse Name: N/A  . Number of Children: 2  . Years of Education: N/A   Occupational History  . Disabled.    Social History Main Topics  . Smoking status: Current Every Day Smoker -- 1.00 packs/day for 36 years    Types: Cigarettes    Start date: 02/23/1979  . Smokeless tobacco: Never Used  . Alcohol Use: No     Comment: Quit drinking 6 months ago.  . Drug Use: Not on file     Comment: Marijuana  . Sexual Activity: Not on file   Other Topics Concern  . Not on file   Social History Narrative   Divorced. Lives alone.  Ambulates independently.    Family history:   Family History  Problem Relation Age of Onset  . Heart failure Mother     Died at 92  . COPD Mother   . Heart attack Father     Died at 50  . Peripheral vascular disease Sister   . Asthma Sister   .  Hypertension Sister   . Diabetes Sister     Allergies   Meperidine and related  Current Medications:   Prior to Admission medications   Medication Sig Start Date End Date Taking? Authorizing Provider  ALPRAZolam (XANAX) 0.25 MG tablet Take 0.25 mg by mouth at bedtime.   Yes Historical Provider, MD  aspirin EC 81 MG tablet Take 81 mg by mouth daily.   Yes Historical Provider, MD  carvedilol (COREG) 12.5 MG tablet Take 6.25 mg by mouth 2 (two) times daily.  03/12/15  Yes Historical Provider, MD  losartan (COZAAR) 50 MG tablet Take 50 mg by mouth daily.   Yes Historical Provider, MD  traMADol (ULTRAM) 50 MG tablet Take 1 tablet (50 mg total) by mouth every 6 (six) hours as needed for moderate pain. 03/04/15  Yes Gilbert Isaac, MD    Physical Exam:   Filed Vitals:   03/12/15 1335 03/12/15  1340 03/12/15 1400 03/12/15 1501  BP:   116/66 104/75  Pulse:   76 77  Temp: 97.1 F (36.2 C)   98.6 F (37 C)  TempSrc: Rectal   Oral  Resp:   16 17  Height:  '5\' 8"'$  (1.727 m)    Weight:  53.071 kg (117 lb)    SpO2:   99% 97%     Physical Exam: Blood pressure 104/75, pulse 77, temperature 98.6 F (37 C), temperature source Oral, resp. rate 17, height '5\' 8"'$  (1.727 m), weight 53.071 kg (117 lb), SpO2 97 %. Gen: No acute distress.  Thin, cachectic appearing. Head: Normocephalic, atraumatic. Eyes: PERRL, EOMI, sclerae nonicteric. Mouth: Oropharynx clear.  Edentulous. Neck: Supple, no thyromegaly, no lymphadenopathy, no jugular venous distention. Chest: Lungs diminished. CV: Heart sounds are regular, no M/R/G. Abdomen: Soft, nontender, nondistended with normal active bowel sounds. Extremities: Extremities are without edema.  +Clubbing. Skin: Warm and dry. Neuro: Alert and oriented times 3; Grossly non-focal. Psych: Mood and affect normal.   Data Review:    Labs: Basic Metabolic Panel:  Recent Labs Lab 03/12/15 1314  NA 132*  K 4.4  CL 95*  CO2 24  GLUCOSE 107*  BUN 10  CREATININE 0.56*  CALCIUM 9.1   Liver Function Tests:  Recent Labs Lab 03/12/15 1314  AST 20  ALT 10*  ALKPHOS 112  BILITOT 0.7  PROT 7.1  ALBUMIN 3.3*   CBC:  Recent Labs Lab 03/10/15 0930 03/12/15 1314  WBC 8.3 9.3  NEUTROABS  --  6.6  HGB 12.9* 11.8*  HCT 38.1* 36.7*  MCV 83.7 86.4  PLT 464* 475*   Cardiac Enzymes:  Recent Labs Lab 03/12/15 1319  TROPONINI <0.03    BNP (last 3 results) No results for input(s): PROBNP in the last 8760 hours. CBG: No results for input(s): GLUCAP in the last 168 hours.  Radiographic Studies: Dg Chest Port 1 View  03/12/2015   CLINICAL DATA:  Lung carcinoma with chest pain  EXAM: PORTABLE CHEST - 1 VIEW  COMPARISON:  March 10, 2015  FINDINGS: There is a mass with postobstructive pneumonitis involving much of the left upper lobe. Lungs  elsewhere are clear. Heart size and pulmonary vascular normal. This mass extends into the region of the left hilum with stable fullness in the left hilum. No pneumothorax. No bone lesions. Patient is status post coronary artery bypass grafting.  IMPRESSION: No pneumothorax. Persistent mass with volume loss left upper lobe. No new opacity. No change in cardiac silhouette.   Electronically Signed   By: Gwyndolyn Saxon  Jasmine December III M.D.   On: 03/12/2015 13:18   *I have personally reviewed the images above*  EKG: Independently reviewed. NSR. Q waves III and AVF, T wave abnormalities laterally.   Assessment/Plan:   Principal Problem:   Hypotension - Non-toxic appearing.  No leukocytosis.  CXR negative for pneumonia. - Likely from ongoing anti-hypertensive use in the setting of weight loss. - Hold anti-hypertensives, hydrate and monitor on telemetry.  Active Problems:   Severe protein calorie malnutrition/weakness - Dietician and PT consult.    CAD (coronary artery disease) - Cycle troponins given chest pain and EKG abnormalities. - Continue ASA. - Monitor on telemetry.    Tobacco abuse - RN to provide tobacco cessation counseling.   - Nicotine patch.    DVT prophylaxis - Lovenox ordered.  Code Status: Full. Family Communication: Drucilla Schmidt (sister) [phone]. Both sisters updated. Disposition Plan: Home 03/13/15 if BP improved.  Time spent: 1 hour.  Elinora Weigand [facility] Pager [phone] Cell: [phone]   If 7PM-7AM, please contact night-coverage [email or web address] Password [facility] 03/12/2015, 4:28 PM

## 2015-03-12 NOTE — ED Provider Notes (Signed)
CSN: [id number]     Arrival date & time 03/12/15  1109 History   First MD Initiated Contact with Patient 03/12/15 1221     Chief Complaint  Patient presents with  . Post-op Problem  . Hypotension     (Consider location/radiation/quality/duration/timing/severity/associated sxs/prior Treatment) The history is provided by the patient. No language interpreter was used.  Gilbert Williams is a 54 y/o M with PMHx of bladder cancer 2, bone cancer the right lower extremity in 1984 with amputation at the hip, lung cancer recently diagnosed one week ago, coronary artery disease with CABG in 2009 presenting to the ED with left-sided chest discomfort, decreased eating, increased fatigue, lethargy and hypotension. Patient reported that he was recently diagnosed with a lung tumor to the left lung approximate 1 week ago where she had a needle guided biopsy performed 3 days ago by Dr. Servando Snare, oncologist. Patient reports that over the past month he's been experiencing left-sided chest pain described as a pressure with increased shortness of breath. States he has been feeling nauseous while eating with early siting. Reports he's been having intermittent blurred vision bilaterally the past few weeks. Patient reports he's been feeling extremely weak and out of energy. Patient reports that he was due to get a PET scan yesterday, but reported that he was so weak yesterday that he did not go. Patient reported that he is living with his sister who is predominantly taking care of him. Sister reported that Dr. Servando Snare office was consulted and the nurse recommended patient to come in to be admitted for beginnings of radiation. Patient reports it is as have a history of smoking, reports he smokes approximately one pack of cigarettes per day for the past 35-36 years. Denied fever, chills, leg swelling, throat closing sensation, throat pain, difficulty swallowing, fainting, headache, numbness, tingling, abdominal pain, cough,  pertussis. PCP Dr. Tressie Ellis Oncology Dr. Servando Snare  Past Medical History  Diagnosis Date  . Coronary artery disease   . Hypertension   . Bladder cancer     x2  . Myocardial infarction 2009  . Lung cancer   . Bone cancer    Past Surgical History  Procedure Laterality Date  . Coronary artery bypass graft  2009  . Leg amputation above knee Right 1981   Family History  Problem Relation Age of Onset  . Heart failure Mother     Died at 44  . COPD Mother   . Heart attack Father     Died at 26  . Peripheral vascular disease Sister   . Asthma Sister   . Hypertension Sister   . Diabetes Sister    History  Substance Use Topics  . Smoking status: Current Every Day Smoker -- 1.00 packs/day for 36 years    Types: Cigarettes    Start date: 02/23/1979  . Smokeless tobacco: Never Used  . Alcohol Use: No     Comment: Quit drinking 6 months ago.    Review of Systems  Constitutional: Positive for appetite change and fatigue. Negative for fever and chills.  Respiratory: Positive for cough, chest tightness and shortness of breath.   Cardiovascular: Positive for chest pain.  Gastrointestinal: Positive for nausea. Negative for vomiting, abdominal pain, diarrhea, constipation, blood in stool and anal bleeding.  Neurological: Negative for dizziness, weakness and headaches.      Allergies  Meperidine and related  Home Medications   Prior to Admission medications   Medication Sig Start Date End Date Taking? Authorizing Provider  ALPRAZolam Duanne Moron) 0.25  MG tablet Take 0.25 mg by mouth at bedtime.   Yes Historical Provider, MD  aspirin EC 81 MG tablet Take 81 mg by mouth daily.   Yes Historical Provider, MD  carvedilol (COREG) 12.5 MG tablet Take 6.25 mg by mouth 2 (two) times daily.  03/12/15  Yes Historical Provider, MD  losartan (COZAAR) 50 MG tablet Take 50 mg by mouth daily.   Yes Historical Provider, MD  traMADol (ULTRAM) 50 MG tablet Take 1 tablet (50 mg total) by mouth every 6 (six)  hours as needed for moderate pain. 03/04/15  Yes Grace Isaac, MD   BP 104/75 mmHg  Pulse 77  Temp(Src) 98.6 F (37 C) (Oral)  Resp 17  Ht '5\' 8"'$  (1.727 m)  Wt 117 lb (53.071 kg)  BMI 17.79 kg/m2  SpO2 97% Physical Exam  Constitutional: He is oriented to person, place, and time. No distress.  Frail-appearing middle-aged male  HENT:  Head: Normocephalic and atraumatic.  Dry mucous membranes  Eyes: Conjunctivae and EOM are normal. Pupils are equal, round, and reactive to light. Right eye exhibits no discharge. Left eye exhibits no discharge.  Neck: Normal range of motion. Neck supple.  Cardiovascular: Normal rate, regular rhythm and normal heart sounds.  Exam reveals no friction rub.   No murmur heard. Pulses:      Radial pulses are 2+ on the right side, and 2+ on the left side.       Dorsalis pedis pulses are 2+ on the left side.  Pulmonary/Chest: Effort normal. No respiratory distress. He has no wheezes. He has no rales. He exhibits no tenderness.  Decreased breath sounds to upper and lower lobes of the left side  Small site of needle incision noted to the left side of the chest with negative swelling, erythema, inflammation,lesions, sores, deformities, drainage or bleeding noted. Negative pain upon palpation.   Abdominal: Soft. Bowel sounds are normal. He exhibits no distension. There is no tenderness. There is no rebound and no guarding.  Musculoskeletal: Normal range of motion.  Right lower extremity amputation noted  Neurological: He is alert and oriented to person, place, and time. No cranial nerve deficit. He exhibits normal muscle tone. Coordination normal. GCS eye subscore is 4. GCS verbal subscore is 5. GCS motor subscore is 6.  Skin: Skin is warm and dry. No rash noted. He is not diaphoretic. No erythema.  Psychiatric: He has a normal mood and affect. His behavior is normal. Thought content normal.  Nursing note and vitals reviewed.   ED Course  Procedures  (including critical care time)  Results for orders placed or performed during the hospital encounter of 03/12/15  CBC with Differential/Platelet  Result Value Ref Range   WBC 9.3 4.0 - 10.5 K/uL   RBC 4.25 4.22 - 5.81 MIL/uL   Hemoglobin 11.8 (L) 13.0 - 17.0 g/dL   HCT 36.7 (L) 39.0 - 52.0 %   MCV 86.4 78.0 - 100.0 fL   MCH 27.8 26.0 - 34.0 pg   MCHC 32.2 30.0 - 36.0 g/dL   RDW 15.8 (H) 11.5 - 15.5 %   Platelets 475 (H) 150 - 400 K/uL   Neutrophils Relative % 71 43 - 77 %   Neutro Abs 6.6 1.7 - 7.7 K/uL   Lymphocytes Relative 17 12 - 46 %   Lymphs Abs 1.6 0.7 - 4.0 K/uL   Monocytes Relative 8 3 - 12 %   Monocytes Absolute 0.7 0.1 - 1.0 K/uL   Eosinophils Relative 3  0 - 5 %   Eosinophils Absolute 0.3 0.0 - 0.7 K/uL   Basophils Relative 1 0 - 1 %   Basophils Absolute 0.1 0.0 - 0.1 K/uL  Comprehensive metabolic panel  Result Value Ref Range   Sodium 132 (L) 135 - 145 mmol/L   Potassium 4.4 3.5 - 5.1 mmol/L   Chloride 95 (L) 101 - 111 mmol/L   CO2 24 22 - 32 mmol/L   Glucose, Bld 107 (H) 65 - 99 mg/dL   BUN 10 6 - 20 mg/dL   Creatinine, Ser 0.56 (L) 0.61 - 1.24 mg/dL   Calcium 9.1 8.9 - 10.3 mg/dL   Total Protein 7.1 6.5 - 8.1 g/dL   Albumin 3.3 (L) 3.5 - 5.0 g/dL   AST 20 15 - 41 U/L   ALT 10 (L) 17 - 63 U/L   Alkaline Phosphatase 112 38 - 126 U/L   Total Bilirubin 0.7 0.3 - 1.2 mg/dL   GFR calc non Af Amer >60 >60 mL/min   GFR calc Af Amer >60 >60 mL/min   Anion gap 13 5 - 15  Urinalysis, Routine w reflex microscopic (not at [facility])  Result Value Ref Range   Color, Urine AMBER (A) YELLOW   APPearance CLEAR CLEAR   Specific Gravity, Urine 1.013 1.005 - 1.030   pH 6.0 5.0 - 8.0   Glucose, UA NEGATIVE NEGATIVE mg/dL   Hgb urine dipstick NEGATIVE NEGATIVE   Bilirubin Urine SMALL (A) NEGATIVE   Ketones, ur 40 (A) NEGATIVE mg/dL   Protein, ur NEGATIVE NEGATIVE mg/dL   Urobilinogen, UA 2.0 (H) 0.0 - 1.0 mg/dL   Nitrite NEGATIVE NEGATIVE   Leukocytes, UA NEGATIVE NEGATIVE   Protime-INR  Result Value Ref Range   Prothrombin Time 14.9 11.6 - 15.2 seconds   INR 1.15 0.00 - 1.49  APTT  Result Value Ref Range   aPTT 33 24 - 37 seconds  Troponin I  Result Value Ref Range   Troponin I <0.03 <0.031 ng/mL  I-Stat CG4 Lactic Acid, ED  Result Value Ref Range   Lactic Acid, Venous 1.79 0.5 - 2.0 mmol/L    Labs Review Labs Reviewed  CBC WITH DIFFERENTIAL/PLATELET - Abnormal; Notable for the following:    Hemoglobin 11.8 (*)    HCT 36.7 (*)    RDW 15.8 (*)    Platelets 475 (*)    All other components within normal limits  COMPREHENSIVE METABOLIC PANEL - Abnormal; Notable for the following:    Sodium 132 (*)    Chloride 95 (*)    Glucose, Bld 107 (*)    Creatinine, Ser 0.56 (*)    Albumin 3.3 (*)    ALT 10 (*)    All other components within normal limits  URINALYSIS, ROUTINE W REFLEX MICROSCOPIC (NOT AT [facility]) - Abnormal; Notable for the following:    Color, Urine AMBER (*)    Bilirubin Urine SMALL (*)    Ketones, ur 40 (*)    Urobilinogen, UA 2.0 (*)    All other components within normal limits  CULTURE, BLOOD (ROUTINE X 2)  CULTURE, BLOOD (ROUTINE X 2)  PROTIME-INR  APTT  TROPONIN I  I-STAT CG4 LACTIC ACID, ED    Imaging Review Dg Chest Port 1 View  03/12/2015   CLINICAL DATA:  Lung carcinoma with chest pain  EXAM: PORTABLE CHEST - 1 VIEW  COMPARISON:  March 10, 2015  FINDINGS: There is a mass with postobstructive pneumonitis involving much of the left upper lobe. Lungs  elsewhere are clear. Heart size and pulmonary vascular normal. This mass extends into the region of the left hilum with stable fullness in the left hilum. No pneumothorax. No bone lesions. Patient is status post coronary artery bypass grafting.  IMPRESSION: No pneumothorax. Persistent mass with volume loss left upper lobe. No new opacity. No change in cardiac silhouette.   Electronically Signed   By: Lowella Grip III M.D.   On: 03/12/2015 13:18     EKG  Interpretation   Date/Time:  Friday March 12 2015 12:04:37 EDT Ventricular Rate:  81 PR Interval:  122 QRS Duration: 106 QT Interval:  398 QTC Calculation: 462 R Axis:   59 Text Interpretation:  Sinus rhythm Probable left atrial enlargement  Consider inferior infarct Abnormal T, consider ischemia, lateral leads  Confirmed by Alvino Chapel  MD, Ovid Curd [phone]) on 03/12/2015 3:40:02 PM       Orthostatic VS for the past 24 hrs:  BP- Lying Pulse- Lying BP- Sitting Pulse- Sitting BP- Standing at 0 minutes Pulse- Standing at 0 minutes  03/12/15 1336 126/73 mmHg 84 120/75 mmHg 81 113/65 mmHg 82   3:21 PM This provider spoke with Dr. Rockne Menghini, [facility]. Concerns regarding hypotension and patient's history. Patient to be admitted to Telemetry.   MDM   Final diagnoses:  Hypotension, unspecified hypotension type  Malignant neoplasm of upper lobe of left lung  History of bone cancer  History of bladder cancer    Medications  sodium chloride 0.9 % bolus 1,000 mL (0 mLs Intravenous Stopped 03/12/15 1401)  sodium chloride 0.9 % bolus 1,000 mL (1,000 mLs Intravenous New Bag/Given 03/12/15 1510)    Filed Vitals:   03/12/15 1335 03/12/15 1340 03/12/15 1400 03/12/15 1501  BP:   116/66 104/75  Pulse:   76 77  Temp: 97.1 F (36.2 C)   98.6 F (37 C)  TempSrc: Rectal   Oral  Resp:   16 17  Height:  '5\' 8"'$  (1.727 m)    Weight:  117 lb (53.071 kg)    SpO2:   99% 97%   Patient was sent into the ED with increased weakness, fatigue, hypotension. Patient reports that he's been feeling dizzy and lethargic for the past 3 days. Upon arrival to the ED patient's blood pressure was 86/47. Patient reported that this is not normal, patient reports that normally he has a higher blood pressure. Patient reported that he was recently diagnosed with lung cancer about a week ago and had a biopsy performed 3 days ago by Dr. Servando Snare - results pending. CT chest without contrast noted mass to the left upper lobe with  mediastinal extension noted to be bronchogenic carcinoma - mediastinal lymphadenopathy with suspicion for metastatic disease. Small pleural effusion identified.  EKG sinus rhythm with heart rate 81 bpm - no changes when compared to preivous. Troponin negative elevation. CBC negative elevated leukocytosis. Hgb 11.8, Hct 36.7. CMP noted sodium of 132 with glucose of 107 with negative elevated anion gap. PT INR unremarkable. APTT unremarkable. Lactic acid negative elevation. UA noted negative Hgb, nitrites, leukocytes - negative findings of infection. Ketones noted in the urine. Chest xray no pneumothorax. Persistent mass with volume loss left upper lobe. No new opacity identified. No change in cardiac silhouette.  Urine culture and blood culture x 2 pending.  Negative findings of UTI or pyelonephritis. Ketones noted in urine - mild dehydration. EKG negative for changes with negative elevated troponin. Negative findings of pneumonia. Patient given IV fluids with some relief of blood pressure,  but once first liter was complete blood pressure dropped a tad. Patient seen and assessed by attending physician. Patient to be admitted regarding low blood pressure with history. Patient to be admitted under the care [facility]. Patient agreed to plan of care.   Jamse Mead, PA-C 03/12/15 [place], MD 03/12/15 [phone]

## 2015-03-12 NOTE — Telephone Encounter (Signed)
Oncology Nurse Navigator Documentation  Oncology Nurse Navigator Flowsheets 03/12/2015  Referral date to Radon/[facility] 03/05/2015  Navigator Encounter Type Other/  Patient was unable to come to [facility] yesterday due to him going to the hospital.  I noted that he is not in the hospital today and I called to check on him.  He did not go yesterday but is going to the hospital today due to "feeling bad".  I told him I would follow up with him to get him set up for clinic.    Patient Visit Type Phone Call  Barriers/Navigation Needs Education-I explained next steps and that he will need to be see by medical and radiation oncologist (cancer doctors).  He agreed.   Coordination of Care MD Appointments  Time Spent with Patient 15

## 2015-03-12 NOTE — ED Notes (Signed)
Pt states that he has been dx w/ lung cancer.  States that he had a biopsy on 6/1 and has had increased pain at the site.  Has not been eating or drinking.  Pt BP in triage 85 systolic.

## 2015-03-13 DIAGNOSIS — R9431 Abnormal electrocardiogram [ECG] [EKG]: Secondary | ICD-10-CM | POA: Diagnosis not present

## 2015-03-13 DIAGNOSIS — E43 Unspecified severe protein-calorie malnutrition: Secondary | ICD-10-CM | POA: Diagnosis not present

## 2015-03-13 DIAGNOSIS — I959 Hypotension, unspecified: Secondary | ICD-10-CM | POA: Diagnosis not present

## 2015-03-13 DIAGNOSIS — I25810 Atherosclerosis of coronary artery bypass graft(s) without angina pectoris: Secondary | ICD-10-CM | POA: Diagnosis not present

## 2015-03-13 LAB — TROPONIN I

## 2015-03-13 NOTE — Discharge Instructions (Signed)
Hypotension  As your heart beats, it forces blood through your arteries. This force is your blood pressure. If your blood pressure is too low for you to go about your normal activities or to support the organs of your body, you have hypotension. Hypotension is also referred to as low blood pressure. When your blood pressure becomes too low, you may not get enough blood to your brain. As a result, you may feel weak, feel lightheaded, or develop a rapid heart rate. In a more severe case, you may faint.  CAUSES  Various conditions can cause hypotension. These include:  · Blood loss.  · Dehydration.  · Heart or endocrine problems.  · Pregnancy.  · Severe infection.  · Not having a well-balanced diet filled with needed nutrients.  · Severe allergic reactions (anaphylaxis).  Some medicines, such as blood pressure medicine or water pills (diuretics), may lower your blood pressure below normal. Sometimes taking too much medicine or taking medicine not as directed can cause hypotension.  TREATMENT   Hospitalization is sometimes required for hypotension if fluid or blood replacement is needed, if time is needed for medicines to wear off, or if further monitoring is needed. Treatment might include changing your diet, changing your medicines (including medicines aimed at raising your blood pressure), and use of support stockings.  HOME CARE INSTRUCTIONS   · Drink enough fluids to keep your urine clear or pale yellow.  · Take your medicines as directed by your health care provider.  · Get up slowly from reclining or sitting positions. This gives your blood pressure a chance to adjust.  · Wear support stockings as directed by your health care provider.  · Maintain a healthy diet by including nutritious food, such as fruits, vegetables, nuts, whole grains, and lean meats.  SEEK MEDICAL CARE IF:  · You have vomiting or diarrhea.  · You have a fever for more than 2-3 days.  · You feel more thirsty than usual.  · You feel weak and  tired.  SEEK IMMEDIATE MEDICAL CARE IF:   · You have chest pain or a fast or irregular heartbeat.  · You have a loss of feeling in some part of your body, or you lose movement in your arms or legs.  · You have trouble speaking.  · You become sweaty or feel lightheaded.  · You faint.  MAKE SURE YOU:   · Understand these instructions.  · Will watch your condition.  · Will get help right away if you are not doing well or get worse.  Document Released: 09/25/2005 Document Revised: 07/16/2013 Document Reviewed: 03/28/2013  [employer]® Patient Information ©2015 [employer]. This information is not intended to replace advice given to you by your health care provider. Make sure you discuss any questions you have with your health care provider.

## 2015-03-13 NOTE — Evaluation (Signed)
Physical Therapy One Time Evaluation Patient Details Name: Gilbert Williams MRN: [id number] DOB: Oct 16, 1960 Today's Date: 03/13/2015   History of Present Illness  54 y.o. male with a PMH of bladder cancer, bone cancer s/p right AKA, and recently diagnosed lung cancer who reports to the hospital with a chief complaint of dizziness, weakness and uncontrolled chest wall pain and admitted for hypotension  Clinical Impression  Patient evaluated by Physical Therapy with no further acute PT needs identified. All education has been completed and the patient has no further questions. Pt ambulating with crutches at his baseline.  Pt denies any dizziness during session.  No further follow-up Physical Therapy or equipment needs. PT is signing off. Thank you for this referral.     Follow Up Recommendations No PT follow up    Equipment Recommendations  None recommended by PT    Recommendations for Other Services       Precautions / Restrictions Precautions Precautions: Fall Restrictions Weight Bearing Restrictions: No      Mobility  Bed Mobility Overal bed mobility: Modified Independent                Transfers Overall transfer level: Modified independent                  Ambulation/Gait Ambulation/Gait assistance: Supervision;Modified independent (Device/Increase time) Ambulation Distance (Feet): 200 Feet Assistive device: Crutches       General Gait Details: slow pace however steady, no LOB, uses crutches well  Stairs            Wheelchair Mobility    Modified Rankin (Stroke Patients Only)       Balance                                             Pertinent Vitals/Pain Pain Assessment: No/denies pain    Home Living Family/patient expects to be discharged to:: Private residence Living Arrangements: Alone     Home Access: Stairs to enter   Technical brewer of Steps: "a couple" Home Layout: One level Home Equipment: Crutches      Prior Function Level of Independence: Independent with assistive device(s)               Hand Dominance        Extremity/Trunk Assessment               Lower Extremity Assessment: RLE deficits/detail RLE Deficits / Details: hx R AKA       Communication   Communication: No difficulties  Cognition Arousal/Alertness: Awake/alert Behavior During Therapy: WFL for tasks assessed/performed Overall Cognitive Status: Within Functional Limits for tasks assessed                      General Comments      Exercises        Assessment/Plan    PT Assessment Patent does not need any further PT services  PT Diagnosis     PT Problem List    PT Treatment Interventions     PT Goals (Current goals can be found in the Care Plan section) Acute Rehab PT Goals PT Goal Formulation: All assessment and education complete, DC therapy    Frequency     Barriers to discharge        Co-evaluation  End of Session   Activity Tolerance: Patient tolerated treatment well Patient left: in bed;with call bell/phone within reach;with bed alarm set Nurse Communication: Mobility status    Functional Assessment Tool Used: clinical judgement Functional Limitation: Mobility: Walking and moving around Mobility: Walking and Moving Around Current Status [phone]): 0 percent impaired, limited or restricted Mobility: Walking and Moving Around Goal Status [phone]): 0 percent impaired, limited or restricted Mobility: Walking and Moving Around Discharge Status [phone]): 0 percent impaired, limited or restricted    Time: 0857-0906 PT Time Calculation (min) (ACUTE ONLY): 9 min   Charges:   PT Evaluation $Initial PT Evaluation Tier I: 1 Procedure     PT G Codes:   PT G-Codes **NOT FOR INPATIENT CLASS** Functional Assessment Tool Used: clinical judgement Functional Limitation: Mobility: Walking and moving around Mobility: Walking and Moving Around Current Status  (P1025): 0 percent impaired, limited or restricted Mobility: Walking and Moving Around Goal Status (E5277): 0 percent impaired, limited or restricted Mobility: Walking and Moving Around Discharge Status (O2423): 0 percent impaired, limited or restricted    Sherisa Gilvin,KATHrine E 03/13/2015, 9:42 AM Carmelia Bake, PT, DPT 03/13/2015 Pager: [phone]

## 2015-03-13 NOTE — Discharge Summary (Signed)
Physician Discharge Summary  Harris Penton AVW:[id number] DOB: September 27, 1961 DOA: 04/08/15  PCP: Daryel Gerald, MD  Admit date: 04-08-15 Discharge date: 03/13/2015   Recommendations for Outpatient Follow-Up:   1. Recommend F/U BP in 1 week, off anti-hypertensives  F/U Blood cultures.   Discharge Diagnosis:   Principal Problem:    Hypotension Active Problems:    CAD (coronary artery disease)    Tobacco abuse    EKG abnormalities    Severe protein-calorie malnutrition    Weakness   Discharge disposition:  Home.    Discharge Condition: Improved.  Diet recommendation:  Regular.   History of Present Illness:   Gilbert Williams is an 54 y.o. male with a PMH of bladder cancer x 2, bone cancer s/p right AKA, and recently diagnosed lung cancer who reports to the hospital with a chief complaint of dizziness, weakness and uncontrolled chest wall pain. He was referred to see Dr. Julien Nordmann 03/17/15, but cancelled his appointment because he was too weak to go. Upon initial evaluation in the ED, he was found to be persistently hypotensive (on Coreg and Cozaar, which he took this morning) despite IVF and therefore was referred to [facility].   Hospital Course by Problem:   Principal Problem:  Hypotension - Non-toxic appearing. No leukocytosis. CXR negative for pneumonia. - Likely from ongoing anti-hypertensive use in the setting of weight loss. - Blood pressure improved with hydration and holding antihypertensives. - Instructed to hold BP medication, and to F/U with PCP in 1 week for repeat BP check.  Active Problems:  Severe protein calorie malnutrition/weakness - No PT follow up recommended, s/p PT evaluation. - Can be referred to dietician at [facility].   CAD (coronary artery disease) - Troponins negative 4. - Continue ASA. - Monitor on telemetry.   Tobacco abuse - RN to provided tobacco cessation counseling.  - Nicotine patch provided while in the  hospital.   Medical Consultants:    None.   Discharge Exam:   Filed Vitals:   03/13/15 0452  BP: 112/69  Pulse: 82  Temp: 98 F (36.7 C)  Resp: 16   Filed Vitals:   April 08, 2015 1659 04/08/2015 1744 08-Apr-2015 2039 03/13/15 0452  BP: 119/71 122/82 110/65 112/69  Pulse: 80 80 77 82  Temp:  97.5 F (36.4 C) 98 F (36.7 C) 98 F (36.7 C)  TempSrc:  Oral Oral Oral  Resp:  '16 16 16  '$ Height:  '5\' 8"'$  (1.727 m)    Weight:  48.308 kg (106 lb 8 oz)    SpO2: 95% 98% 95% 95%    Gen:  NAD, thin/cachectic appearing Cardiovascular:  RRR, No M/R/G Respiratory: Lungs CTAB Gastrointestinal: Abdomen soft, NT/ND with normal active bowel sounds. Extremities: Right AKA   The results of significant diagnostics from this hospitalization (including imaging, microbiology, ancillary and laboratory) are listed below for reference.     Procedures and Diagnostic Studies:   Dg Chest Port 1 View  2015/04/08   CLINICAL DATA:  Lung carcinoma with chest pain  EXAM: PORTABLE CHEST - 1 VIEW  COMPARISON:  March 10, 2015  FINDINGS: There is a mass with postobstructive pneumonitis involving much of the left upper lobe. Lungs elsewhere are clear. Heart size and pulmonary vascular normal. This mass extends into the region of the left hilum with stable fullness in the left hilum. No pneumothorax. No bone lesions. Patient is status post coronary artery bypass grafting.  IMPRESSION: No pneumothorax. Persistent mass with volume loss left upper lobe. No new  opacity. No change in cardiac silhouette.   Electronically Signed   By: Lowella Grip III M.D.   On: 03/12/2015 13:18     Labs:   Basic Metabolic Panel:  Recent Labs Lab 03/12/15 1314  NA 132*  K 4.4  CL 95*  CO2 24  GLUCOSE 107*  BUN 10  CREATININE 0.56*  CALCIUM 9.1   GFR Estimated Creatinine Clearance: 74.6 mL/min (by C-G formula based on Cr of 0.56). Liver Function Tests:  Recent Labs Lab 03/12/15 1314  AST 20  ALT 10*  ALKPHOS 112   BILITOT 0.7  PROT 7.1  ALBUMIN 3.3*   Coagulation profile  Recent Labs Lab 03/10/15 0930 03/12/15 1314  INR 1.11 1.15    CBC:  Recent Labs Lab 03/10/15 0930 03/12/15 1314  WBC 8.3 9.3  NEUTROABS  --  6.6  HGB 12.9* 11.8*  HCT 38.1* 36.7*  MCV 83.7 86.4  PLT 464* 475*   Cardiac Enzymes:  Recent Labs Lab 03/12/15 1319 03/12/15 1803 03/12/15 2357 03/13/15 0601  TROPONINI <0.03 <0.03 <0.03 <0.03   Microbiology Recent Results (from the past 240 hour(s))  Culture, blood (routine x 2)     Status: None (Preliminary result)   Collection Time: 03/12/15  1:05 PM  Result Value Ref Range Status   Specimen Description BLOOD RIGHT ANTECUBITAL  Final   Special Requests BOTTLES DRAWN AEROBIC AND ANAEROBIC 4.5CC EACH  Final   Culture   Final           BLOOD CULTURE RECEIVED NO GROWTH TO DATE CULTURE WILL BE HELD FOR 5 DAYS BEFORE ISSUING A FINAL NEGATIVE REPORT Performed at [facility]    Report Status PENDING  Incomplete  Culture, blood (routine x 2)     Status: None (Preliminary result)   Collection Time: 03/12/15  1:05 PM  Result Value Ref Range Status   Specimen Description BLOOD RIGHT ARM  Final   Special Requests BOTTLES DRAWN AEROBIC AND ANAEROBIC 5CC EACH  Final   Culture   Final           BLOOD CULTURE RECEIVED NO GROWTH TO DATE CULTURE WILL BE HELD FOR 5 DAYS BEFORE ISSUING A FINAL NEGATIVE REPORT Performed at [facility]    Report Status PENDING  Incomplete     Discharge Instructions:   Discharge Instructions    Call MD for:  extreme fatigue    Complete by:  As directed      Call MD for:  persistant nausea and vomiting    Complete by:  As directed      Call MD for:  severe uncontrolled pain    Complete by:  As directed      Call MD for:  temperature >100.4    Complete by:  As directed      Diet general    Complete by:  As directed      Discharge instructions    Complete by:  As directed   You were cared for by Dr. Jacquelynn Cree   (a hospitalist) during your hospital stay. If you have any questions about your discharge medications or the care you received while you were in the hospital after you are discharged, you can call the unit and ask to speak with the hospitalist on call if the hospitalist that took care of you is not available. Once you are discharged, your primary care physician will handle any further medical issues. Please note that NO REFILLS for any discharge medications will be  authorized once you are discharged, as it is imperative that you return to your primary care physician (or establish a relationship with a primary care physician if you do not have one) for your aftercare needs so that they can reassess your need for medications and monitor your lab values.  Any outstanding tests can be reviewed by your PCP at your follow up visit.  It is also important to review any medicine changes with your PCP.  Please bring these d/c instructions with you to your next visit so your physician can review these changes with you.  If you do not have a primary care physician, you can call [phone] for a physician referral.  It is highly recommended that you obtain a PCP for hospital follow up.     Increase activity slowly    Complete by:  As directed             Medication List    STOP taking these medications        carvedilol 12.5 MG tablet  Commonly known as:  COREG     losartan 50 MG tablet  Commonly known as:  COZAAR      TAKE these medications        ALPRAZolam 0.25 MG tablet  Commonly known as:  XANAX  Take 0.25 mg by mouth at bedtime.     aspirin EC 81 MG tablet  Take 81 mg by mouth daily.     traMADol 50 MG tablet  Commonly known as:  ULTRAM  Take 1 tablet (50 mg total) by mouth every 6 (six) hours as needed for moderate pain.           Follow-up Information    Schedule an appointment as soon as possible for a visit with Daryel Gerald, MD.   Specialty:  Family Medicine   Why:  If  symptoms worsen   Contact information:   [address] [place] [address] [phone]       Follow up with Eilleen Kempf., MD. Schedule an appointment as soon as possible for a visit in 1 week.   Specialty:  Oncology   Why:  Further treatment recommendations for lung cancer.   Contact information:   [place] [address] [phone]        Time coordinating discharge: 25 minutes.  Signed:  Tequlia Gonsalves  Pager [phone] [facility] 03/13/2015, 10:26 AM

## 2015-03-15 ENCOUNTER — Telehealth: Payer: Self-pay | Admitting: *Deleted

## 2015-03-15 ENCOUNTER — Other Ambulatory Visit: Payer: Self-pay | Admitting: *Deleted

## 2015-03-15 ENCOUNTER — Ambulatory Visit: Payer: Medicare HMO | Admitting: Cardiothoracic Surgery

## 2015-03-15 DIAGNOSIS — C3412 Malignant neoplasm of upper lobe, left bronchus or lung: Secondary | ICD-10-CM

## 2015-03-15 NOTE — Telephone Encounter (Signed)
Oncology Nurse Navigator Documentation  Oncology Nurse Navigator Flowsheets 03/15/2015  Referral date to RadOnc/MedOnc   Navigator Encounter Type Other/Phone call   Patient Visit Type   Treatment Phase Other/pre-treatment  Barriers/Navigation Needs Setting up appt  Coordination of Care MD Appointments/ Called patient and left a vm message regarding appt for thoracic clinic on 03/18/15 arrive at 12:30.  I called [facility] and spoke with Charlean Sanfilippo.  I gave her patient appt for clinic this week and asked that she notify him.  She stated she would.   Time Spent with Patient 15

## 2015-03-17 ENCOUNTER — Telehealth: Payer: Self-pay | Admitting: *Deleted

## 2015-03-17 NOTE — Telephone Encounter (Signed)
Called and spoke w/ pts's sister Joseph Art w/ reminder about clinic tomorrow 6/9. Gave directions and instructions.

## 2015-03-18 ENCOUNTER — Ambulatory Visit (HOSPITAL_BASED_OUTPATIENT_CLINIC_OR_DEPARTMENT_OTHER): Payer: Medicare HMO | Admitting: Internal Medicine

## 2015-03-18 ENCOUNTER — Ambulatory Visit: Payer: Medicare HMO | Attending: Internal Medicine | Admitting: Physical Therapy

## 2015-03-18 ENCOUNTER — Encounter: Payer: Self-pay | Admitting: *Deleted

## 2015-03-18 ENCOUNTER — Ambulatory Visit (HOSPITAL_COMMUNITY)
Admission: RE | Admit: 2015-03-18 | Discharge: 2015-03-18 | Disposition: A | Discharge status: Home / Self Care | Payer: Medicare HMO | Source: Ambulatory Visit | Attending: Cardiothoracic Surgery | Admitting: Cardiothoracic Surgery

## 2015-03-18 ENCOUNTER — Other Ambulatory Visit: Payer: Self-pay | Admitting: Neurosurgery

## 2015-03-18 ENCOUNTER — Ambulatory Visit
Admission: RE | Admit: 2015-03-18 | Discharge: 2015-03-18 | Disposition: A | Discharge status: Home / Self Care | Payer: Medicare HMO | Source: Ambulatory Visit | Attending: Radiation Oncology | Admitting: Radiation Oncology

## 2015-03-18 ENCOUNTER — Ambulatory Visit (HOSPITAL_COMMUNITY)
Admission: RE | Admit: 2015-03-18 | Discharge: 2015-03-18 | Disposition: A | Discharge status: Home / Self Care | Payer: Medicare HMO | Source: Ambulatory Visit

## 2015-03-18 ENCOUNTER — Other Ambulatory Visit (HOSPITAL_BASED_OUTPATIENT_CLINIC_OR_DEPARTMENT_OTHER): Payer: Medicare HMO

## 2015-03-18 ENCOUNTER — Ambulatory Visit: Payer: Medicare HMO

## 2015-03-18 ENCOUNTER — Encounter: Payer: Self-pay | Admitting: Internal Medicine

## 2015-03-18 ENCOUNTER — Telehealth: Payer: Self-pay | Admitting: Internal Medicine

## 2015-03-18 ENCOUNTER — Other Ambulatory Visit: Payer: Self-pay | Admitting: Cardiothoracic Surgery

## 2015-03-18 ENCOUNTER — Encounter (HOSPITAL_COMMUNITY)
Admission: RE | Admit: 2015-03-18 | Discharge: 2015-03-18 | Disposition: A | Discharge status: Home / Self Care | Payer: Medicare HMO | Source: Ambulatory Visit | Attending: Cardiothoracic Surgery | Admitting: Cardiothoracic Surgery

## 2015-03-18 ENCOUNTER — Ambulatory Visit (HOSPITAL_COMMUNITY): Admission: RE | Admit: 2015-03-18 | Payer: Medicare HMO | Source: Ambulatory Visit

## 2015-03-18 VITALS — BP 116/77 | HR 102 | Temp 97.8°F | Resp 20 | Ht 68.0 in | Wt 100.0 lb

## 2015-03-18 VITALS — BP 122/73 | HR 101 | Temp 97.5°F | Resp 18 | Ht 68.0 in | Wt 100.5 lb

## 2015-03-18 DIAGNOSIS — C771 Secondary and unspecified malignant neoplasm of intrathoracic lymph nodes: Secondary | ICD-10-CM | POA: Diagnosis not present

## 2015-03-18 DIAGNOSIS — R079 Chest pain, unspecified: Secondary | ICD-10-CM | POA: Diagnosis not present

## 2015-03-18 DIAGNOSIS — C3412 Malignant neoplasm of upper lobe, left bronchus or lung: Secondary | ICD-10-CM | POA: Insufficient documentation

## 2015-03-18 DIAGNOSIS — R42 Dizziness and giddiness: Secondary | ICD-10-CM

## 2015-03-18 DIAGNOSIS — R293 Abnormal posture: Secondary | ICD-10-CM | POA: Diagnosis not present

## 2015-03-18 DIAGNOSIS — R269 Unspecified abnormalities of gait and mobility: Secondary | ICD-10-CM | POA: Insufficient documentation

## 2015-03-18 DIAGNOSIS — C7931 Secondary malignant neoplasm of brain: Secondary | ICD-10-CM | POA: Diagnosis not present

## 2015-03-18 DIAGNOSIS — C7951 Secondary malignant neoplasm of bone: Secondary | ICD-10-CM | POA: Diagnosis not present

## 2015-03-18 DIAGNOSIS — R0602 Shortness of breath: Secondary | ICD-10-CM | POA: Insufficient documentation

## 2015-03-18 DIAGNOSIS — R609 Edema, unspecified: Secondary | ICD-10-CM

## 2015-03-18 DIAGNOSIS — G939 Disorder of brain, unspecified: Secondary | ICD-10-CM | POA: Diagnosis not present

## 2015-03-18 DIAGNOSIS — R0789 Other chest pain: Secondary | ICD-10-CM

## 2015-03-18 LAB — COMPREHENSIVE METABOLIC PANEL (CC13)
ALK PHOS: 123 U/L (ref 40–150)
AST: 22 U/L (ref 5–34)
Albumin: 3 g/dL — ABNORMAL LOW (ref 3.5–5.0)
Anion Gap: 13 mEq/L — ABNORMAL HIGH (ref 3–11)
BUN: 8.9 mg/dL (ref 7.0–26.0)
CALCIUM: 9 mg/dL (ref 8.4–10.4)
CHLORIDE: 98 meq/L (ref 98–109)
CO2: 20 meq/L — AB (ref 22–29)
Creatinine: 0.6 mg/dL — ABNORMAL LOW (ref 0.7–1.3)
EGFR: >90 mL/min/{1.73_m2} (ref >90)
GLUCOSE: 86 mg/dL (ref 70–140)
Potassium: 4.2 mEq/L (ref 3.5–5.1)
Sodium: 132 mEq/L — ABNORMAL LOW (ref 136–145)
Total Bilirubin: 0.77 mg/dL (ref 0.20–1.20)
Total Protein: 6.9 g/dL (ref 6.4–8.3)

## 2015-03-18 LAB — CBC WITH DIFFERENTIAL/PLATELET
BASO%: 0.4 % (ref 0.0–2.0)
Basophils Absolute: 0 10*3/uL (ref 0.0–0.1)
EOS ABS: 0.2 10*3/uL (ref 0.0–0.5)
EOS%: 2.1 % (ref 0.0–7.0)
HCT: 36.8 % — ABNORMAL LOW (ref 38.4–49.9)
HGB: 12.1 g/dL — ABNORMAL LOW (ref 13.0–17.1)
LYMPH%: 20.5 % (ref 14.0–49.0)
MCH: 27.9 pg (ref 27.2–33.4)
MCHC: 32.9 g/dL (ref 32.0–36.0)
MCV: 84.8 fL (ref 79.3–98.0)
MONO#: 0.9 10*3/uL (ref 0.1–0.9)
MONO%: 9.8 % (ref 0.0–14.0)
NEUT%: 67.2 % (ref 39.0–75.0)
NEUTROS ABS: 5.9 10*3/uL (ref 1.5–6.5)
PLATELETS: 495 10*3/uL — AB (ref 140–400)
RBC: 4.34 10*6/uL (ref 4.20–5.82)
RDW: 16.6 % — ABNORMAL HIGH (ref 11.0–14.6)
WBC: 8.8 10*3/uL (ref 4.0–10.3)
lymph#: 1.8 10*3/uL (ref 0.9–3.3)

## 2015-03-18 LAB — CULTURE, BLOOD (ROUTINE X 2)
CULTURE: NO GROWTH
CULTURE: NO GROWTH

## 2015-03-18 LAB — GLUCOSE, CAPILLARY: Glucose-Capillary: 96 mg/dL (ref 65–99)

## 2015-03-18 MED ORDER — SODIUM CHLORIDE 0.9 % IJ SOLN
10.0000 mL | Freq: Once | INTRAMUSCULAR | Status: AC
  Administered 2015-03-18: 10 mL via INTRAVENOUS

## 2015-03-18 MED ORDER — FLUDEOXYGLUCOSE F - 18 (FDG) INJECTION
5.2200 | Freq: Once | INTRAVENOUS | Status: AC | PRN
  Administered 2015-03-18: 5.22 via INTRAVENOUS

## 2015-03-18 MED ORDER — GADOBENATE DIMEGLUMINE 529 MG/ML IV SOLN
10.0000 mL | Freq: Once | INTRAVENOUS | Status: AC | PRN
  Administered 2015-03-18: 10 mL via INTRAVENOUS

## 2015-03-18 MED ORDER — DEXAMETHASONE 4 MG PO TABS: 4.0000 mg | ORAL_TABLET | Freq: Four times a day (QID) | ORAL | Status: AC

## 2015-03-18 NOTE — Therapy (Signed)
[facility] [address] [place], [place], [address] Phone: [phone]   Fax:  [phone]  Physical Therapy Evaluation  Patient Details  Name: Gilbert Williams MRN: [id number] Date of Birth: 1961-01-17 Referring Provider:  Si Gaul, MD  Encounter Date: 03/18/2015      PT End of Session - 03/18/15 1451    Visit Number 1   Number of Visits 1   PT Start Time 1420   PT Stop Time 1435   PT Time Calculation (min) 15 min   Activity Tolerance Patient tolerated treatment well   Behavior During Therapy [facility] for tasks assessed/performed      Past Medical History  Diagnosis Date  . Coronary artery disease   . Hypertension   . Bladder cancer     x2  . Myocardial infarction 2009  . Lung cancer   . Bone cancer     Past Surgical History  Procedure Laterality Date  . Coronary artery bypass graft  2009  . Leg amputation above knee Right 1981    There were no vitals filed for this visit.  Visit Diagnosis:  Exertional shortness of breath - Plan: PT plan of care cert/re-cert  Abnormality of gait - Plan: PT plan of care cert/re-cert  Poor posture - Plan: PT plan of care cert/re-cert      Subjective Assessment - 03/18/15 1439    Subjective Pt. presented with shoulder/sternal discomfort and workup showed lung mass.   Patient is accompained by: Family member  sister   Pertinent History Workup from pain complaints indicated left upper lobe lung mass, enlarged mediastinal nodes; found to have non-small cell carcinoma most consistent with adenocarcinoma.  Brain MRI showed 5 cm. mass with midline shift and edema.  Patient is expected to have brain radiation, then surgical resection of mass, then chemotherapy for lung and other masses.  Pt. 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   Currently in Pain? No/denies            [facility] PT Assessment  - 03/18/15 0001    Assessment   Medical Diagnosis stage IV lung cancer   Onset Date/Surgical Date --  SRS brain rad. scheduled 03/22/15, surgery 03/23/15   Precautions   Precautions Fall;Other (comment)   Precaution Comments cancer precautions   Restrictions   Weight Bearing Restrictions No   Balance Screen   Has the patient fallen in the past 6 months No   Has the patient had a decrease in activity level because of a fear of falling?  No   Is the patient reluctant to leave their home because of a fear of falling?  No   Home Environment   Living Environment Private residence   Type of Home Mobile home   Prior Function   Level of Independence Independent   Observation/Other Assessments   Observations thin man looking older than his age with right very high above knee amputation, sitting in wheelchair   Posture/Postural Control   Posture/Postural Control Postural limitations   Postural Limitations Forward head  sitting and standing   ROM / Strength   AROM / PROM / Strength AROM;Strength   AROM   Overall AROM  Within functional limits for tasks performed   Overall AROM Comments Both arms and left leg all WFL with testing in wheelchair   Strength   Overall Strength Within functional limits for tasks performed  Overall Strength Comments Both arms grossly 5/5; left leg grossly 5/5; patient feels he is weaker than he was prior to recent health issues developing   Ambulation/Gait   Ambulation/Gait No   Gait Comments Gait not observed today, but patient reports walking with crutches, and that he has done this for 30 years.           LYMPHEDEMA/ONCOLOGY QUESTIONNAIRE - Apr 14, 2015 1449    Type   Cancer Type stage IV lung cancer with brain mets and a spinal met   Surgeries   Other Surgery Date 03/23/15  expected date for brain mass resection s/p SRS on 03/22/15                        PT Education - 04/14/15 1450    Education provided Yes   Education Details  posture, breathing, exercise such as stationary arm bike, energy conservation   Person(s) Educated Patient;Other (comment)  sister   Methods Explanation;Handout   Comprehension Verbalized understanding               Lung Clinic Goals - 14-Apr-2015 1456    Patient will be able to verbalize understanding of the benefit of exercise to decrease fatigue.   Status Achieved   Patient will be able to verbalize the importance of posture.   Status Achieved   Patient will be able to demonstrate diaphragmatic breathing for improved lung function.   Status Achieved   Patient will be able to verbalize understanding of the role of physical therapy to prevent functional decline and who to contact if physical therapy is needed.   Status Achieved             Plan - 04-14-15 1451    Clinical Impression Statement Patient with medical history significant for right AKA s/p sarcoma, bladder cancer, CABG 2009 x 5; current smoker; now diagnosed with stage IV lung cancer with brain mets and one spinal met.  Patient did not bring his crutches in to the cancer center today but has used them for 30 years for ambulation and reports he gets into his mobile home going up steps without a problem.  He does report he feels weaker than he used to be, since recent health issues leading to diagnosis of lung cancer.   He reports shortness of breath with activity.                                                   Pt will benefit from skilled therapeutic intervention in order to improve on the following deficits Cardiopulmonary status limiting activity;Decreased activity tolerance;Abnormal gait   Rehab Potential Fair   PT Frequency One time visit   PT Treatment/Interventions Patient/family education   PT Next Visit Plan None at this time; if patient should need therapy later, he would likely have a reassessment with focus on his specific complaint and a treatment plan established at that time.   PT Home Exercise Plan see  education section   Consulted and Agree with Plan of Care Patient;Family member/caregiver  sister          G-Codes - 2015-04-14 1457    Functional Assessment Tool Used clinical judgement   Functional Limitation Mobility: Walking and moving around   Mobility: Walking and Moving Around Current Status [phone]) At least 1 percent but less  than 20 percent impaired, limited or restricted   Mobility: Walking and Moving Around Goal Status [phone]) At least 1 percent but less than 20 percent impaired, limited or restricted   Mobility: Walking and Moving Around Discharge Status [phone]) At least 1 percent but less than 20 percent impaired, limited or restricted       Problem List Patient Active Problem List   Diagnosis Date Noted  . Hypotension 03/12/2015  . CAD (coronary artery disease) 03/12/2015  . Tobacco abuse 03/12/2015  . EKG abnormalities 03/12/2015  . Severe protein-calorie malnutrition 03/12/2015  . Weakness 03/12/2015  . History of bladder cancer   . History of bone cancer   . Malignant neoplasm of upper lobe of left lung   . Mass in chest   . S/P CABG x 5 02/23/2015  . Lung mass left upper lobe with chest wall and mediastinal invasion 02/23/2015    Korayma Hagwood 03/18/2015, 3:01 PM  [place], [place], [address] Phone: [phone]   Fax:  [place], PT 03/18/2015 3:01 PM

## 2015-03-18 NOTE — Progress Notes (Signed)
[place] Telephone:([phone]) [phone]   [phone] Multidisciplinary thoracic oncology clinic  CONSULT NOTE  REFERRING PHYSICIAN: Dr. Martin Majestic  REASON FOR CONSULTATION:  54 years old white male recently diagnosed with lung cancer.  HPI Gilbert Williams is a 54 y.o. 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 Servando Snare in 2009. The patient also has a history of hypertension as well as malnutrition and early stage bladder cancer status post surgical removal. In early May 2016 the patient called Dr. Servando Snare complaining of still mild discomfort that this site of the clips as well as increasing cough and weight loss of 25 pounds over the previous 3 months. The patient was seen by Dr. Servando Snare and chest x-ray was performed on 02/23/2015 and it showed large left hilar/suprahilar mass lesion with left upper lobe atelectasis and pleural thickening. This was followed by CT scan of the chest without contrast on 03/04/2015 and it showed a mass replaces the left upper lobe, measuring on the order of 9.5 x 8.6 x 10.6 cm craniocaudal. Direct extension to the left hilum and left side of the mediastinum. Left upper lobe in obstruction with significant mass effect on the lingular and left lower lobe bronchi. No gross osseous destruction to confirm chest wall invasion. Satellite parenchymal or pleural-based nodules including at 1.8 and 1.7 cm anteriorly and inferiorly. 11 mm AP window node on image 26 is suspicious. A precarinal node measures 1.0 cm. No dominant right hilar adenopathy.  On 03/10/2015 the patient underwent CT-guided core biopsy of the left upper lobe lung mass by interventional radiology and the final pathology (Accession: [id number]) showed non-small cell carcinoma most consistent with  adenocarcinoma. There is very faint and focal staining with Napsin-A. Cytokeratin 5/6, p63 and TTF-1 stains are negative. Given the H&E appearance as well as the weak and focal Napsin-A staining adenocarcinoma is favored. A request for the tissue to be sent to [facility] one for molecular studies has been done but it is expected to be sent on 03/27/2015 because of the 15 days Medicare rule. A PET scan as well as MRI of the brain were performed earlier today and 03/18/2015. The PET scan showed The large central left upper lobe mass is markedly hypermetabolic. This mass is multilobulated with mediastinal and possible chest wall extension. Separate from the lesion, there are several hypermetabolic nodal metastases within the anterior mediastinum and precarinal stations. Inferior right sacral metastasis with possible left T9 pedicle and left manubrial metastases/ direct extension. No distant non osseous metastases identified. MRI of the brain earlier today showed left cerebellar 5.0 x 4.4 x 3.5 cm mass with calcification/hemorrhage and partial necrosis with surrounding vasogenic edema causing significant compression of the fourth ventricle, left aspect of the medulla, left middle cerebellar peduncle and left superior cerebellar peduncle. There was minimal downward displacement of the left cerebellar peduncle. Dr. Servando Snare kindly referred the patient to me today for evaluation and recommendation regarding treatment of his condition. When seen today the patient continues to have the central chest pain as well as shortness breath increased with exertion with mild cough but no hemoptysis. He also lost around 30 pounds in the last 6 months. He has hoarseness of his voice. He denied having any significant headache or visual changes. The patient denied having any significant nausea or vomiting. Family history significant for  a mother with colon cancer and heart disease, father died from heart attack and sister had uterine  cancer. The patient is single and has 2 children a son 74 and daughter 45. He was accompanied by his sister, Debroah Baller. The patient used to do mechanical work and roofing. He has a history of smoking 1 pack per day for around 35 years and unfortunately he continues to smoke. He has no history of alcohol or drug abuse.  HPI  Past Medical History  Diagnosis Date  . Coronary artery disease   . Hypertension   . Bladder cancer     x2  . Myocardial infarction 2009  . Lung cancer   . Bone cancer     Past Surgical History  Procedure Laterality Date  . Coronary artery bypass graft  2009  . Leg amputation above knee Right 1981    Family History  Problem Relation Age of Onset  . Heart failure Mother     Died at 72  . COPD Mother   . Heart attack Father     Died at 70  . Peripheral vascular disease Sister   . Asthma Sister   . Hypertension Sister   . Diabetes Sister     Social History History  Substance Use Topics  . Smoking status: Current Every Day Smoker -- 1.00 packs/day for 36 years    Types: Cigarettes    Start date: 02/23/1979  . Smokeless tobacco: Never Used  . Alcohol Use: No     Comment: Quit drinking 6 months ago.    Allergies  Allergen Reactions  . Meperidine And Related Other (See Comments)    Demerol////Happened with his amputation surgery,was told he had a bad reaction     Current Outpatient Prescriptions  Medication Sig Dispense Refill  . ALPRAZolam (XANAX) 0.25 MG tablet Take 0.25 mg by mouth at bedtime.    Marland Kitchen aspirin EC 81 MG tablet Take 81 mg by mouth daily.    . carvedilol (COREG) 12.5 MG tablet     . losartan (COZAAR) 50 MG tablet     . traMADol (ULTRAM) 50 MG tablet Take 1 tablet (50 mg total) by mouth every 6 (six) hours as needed for moderate pain. 30 tablet 2  . dexamethasone (DECADRON) 4 MG tablet Take 1 tablet (4 mg total) by mouth 4 (four) times daily. 40 tablet 1   No current facility-administered medications for this visit.    Review of  Systems  Constitutional: positive for anorexia, fatigue and weight loss Eyes: negative Ears, nose, mouth, throat, and face: negative Respiratory: positive for cough, dyspnea on exertion and pleurisy/chest pain Cardiovascular: negative Gastrointestinal: negative Genitourinary:negative Integument/breast: negative Hematologic/lymphatic: negative Musculoskeletal:negative, Right Above-knee amputation Neurological: negative Behavioral/Psych: negative Endocrine: negative Allergic/Immunologic: negative  Physical Exam  NUU:VOZDG, healthy, no distress and malnourished SKIN: skin color, texture, turgor are normal HEAD: Normocephalic, No masses, lesions, tenderness or abnormalities EYES: normal, PERRLA EARS: External ears normal, Canals clear OROPHARYNX:no exudate, no erythema and lips, buccal mucosa, and tongue normal  NECK: supple, no adenopathy, no JVD LYMPH:  no palpable lymphadenopathy, no hepatosplenomegaly LUNGS: decreased breath sounds, expiratory wheezes bilaterally HEART: regular rate & rhythm, no murmurs and no gallops ABDOMEN:abdomen soft, non-tender, normal bowel sounds and no masses or organomegaly BACK: Back symmetric, no curvature., No CVA tenderness EXTREMITIES: Right above-knee amputation  NEURO: alert & oriented x 3 with fluent speech, no focal motor/sensory deficits  PERFORMANCE STATUS: ECOG 1-2  LABORATORY DATA: Lab Results  Component Value Date  WBC 8.8 03/18/2015   HGB 12.1* 03/18/2015   HCT 36.8* 03/18/2015   MCV 84.8 03/18/2015   PLT 495* 03/18/2015      Chemistry      Component Value Date/Time   NA 132* 03/18/2015 1230   NA 132* 03/12/2015 1314   K 4.2 03/18/2015 1230   K 4.4 03/12/2015 1314   CL 95* 03/12/2015 1314   CO2 20* 03/18/2015 1230   CO2 24 03/12/2015 1314   BUN 8.9 03/18/2015 1230   BUN 10 03/12/2015 1314   CREATININE 0.6* 03/18/2015 1230   CREATININE 0.56* 03/12/2015 1314      Component Value Date/Time   CALCIUM 9.0 03/18/2015  1230   CALCIUM 9.1 03/12/2015 1314   ALKPHOS 123 03/18/2015 1230   ALKPHOS 112 03/12/2015 1314   AST 22 03/18/2015 1230   AST 20 03/12/2015 1314   ALT <6 03/18/2015 1230   ALT 10* 03/12/2015 1314   BILITOT 0.77 03/18/2015 1230   BILITOT 0.7 03/12/2015 1314       RADIOGRAPHIC STUDIES: Dg Chest 1 View  03/10/2015   CLINICAL DATA:  Post biopsy  EXAM: CHEST  1 VIEW  COMPARISON:  02/23/2015  FINDINGS: No pneumothorax post biopsy. Left upper lobe mass and left upper lobe collapse are stable. Hyperaeration.  IMPRESSION: No pneumothorax post biopsy.   Electronically Signed   By: Marybelle Killings M.D.   On: 03/10/2015 13:44   Dg Chest 2 View  02/23/2015   CLINICAL DATA:  Sternal pain.  EXAM: CHEST  2 VIEW  COMPARISON:  None.  FINDINGS: Prior CABG. Heart size normal. Pulmonary vascularity normal. Large left suprahilar mass lesion noted with left upper lobe atelectatic changes and pleural thickening. No pleural effusion or pneumothorax. No acute bony abnormality.  IMPRESSION: 1. Large left hilar/ suprahilar mass lesion with left upper lobe atelectasis and pleural thickening. Pulmonary malignancy could present in this fashion. PET-CT should be considered for further evaluation.  2. Prior CABG. Heart size normal. Sternal wires intact. No acute bony abnormality.   Electronically Signed   By: Marcello Moores  Register   On: 02/23/2015 14:21   Ct Chest Wo Contrast  03/04/2015   CLINICAL DATA:  Left suprahilar mass on chest x-ray. Mid chest pain. Smoker. Remote history of right lower extremity bone cancer.  EXAM: CT CHEST WITHOUT CONTRAST  TECHNIQUE: Multidetector CT imaging of the chest was performed following the standard protocol without IV contrast.  COMPARISON:  Chest radiograph of 02/23/2015. Remote chest CT of 08/11/2003.  FINDINGS: Mediastinum/Nodes: Mild bilateral gynecomastia. Aortic and branch vessel atherosclerosis. Prior median sternotomy. Mild motion degradation. Mild cardiomegaly. Multivessel coronary artery  atherosclerosis.  11 mm AP window node on image 26 is suspicious. A precarinal node measures 1.0 cm on image 27. No dominant right hilar adenopathy.  Lungs/Pleura: Small left pleural effusion. Moderate centrilobular emphysema. A Mass replaces the left upper lobe, measuring on the order of 9.5 x 8.6 cm on image 26 of series 6. 10.6 cm craniocaudal. Direct extension to the left hilum and left side of the mediastinum. Example on image 26 of series 3 into the left side of the mediastinum. Left upper lobe in obstruction with significant mass effect on the lingular and left lower lobe bronchi. No gross osseous destruction to confirm chest wall invasion.  Satellite parenchymal or pleural-based nodules including at 1.8 and 1.7 cm anteriorly and inferiorly on image 37 of series 3.  Upper abdomen: Motion degradation continuing. Marked right renal atrophy. Grossly normal imaged portions the liver,  spleen, stomach, pancreas, adrenal glands. Retroaortic left renal vein.  Musculoskeletal: No acute osseous abnormality.  IMPRESSION: 1. Mass replacing the majority of the left upper lobe with direct mediastinal extension, consistent with primary bronchogenic carcinoma. 2. Enlarged mediastinal nodes are highly suspicious for metastatic disease. Pulmonary parenchymal or pleural based satellite nodules within the inferior left hemi thorax are also malignant. 3. Small left pleural effusion. 4. Degraded exam secondary to motion and lack of IV contrast. 5. Advanced atherosclerosis with centrilobular emphysema. 6. Gynecomastia.   Electronically Signed   By: Abigail Miyamoto M.D.   On: 03/04/2015 15:17   Mr Jeri Cos JG Contrast  03/18/2015   CLINICAL DATA:  53 year old hypertensive male with history of bladder cancer, bone cancer and recent diagnosis of lung cancer presenting with dizziness and weakness. Subsequent encounter.  EXAM: MRI HEAD WITHOUT AND WITH CONTRAST  TECHNIQUE: Multiplanar, multiecho pulse sequences of the brain and  surrounding structures were obtained without and with intravenous contrast.  CONTRAST:  61m MULTIHANCE GADOBENATE DIMEGLUMINE 529 MG/ML IV SOLN  COMPARISON:  PET CT same date.  No comparison brain MR or head CT.  FINDINGS: Exam is motion degraded (patient was claustrophobic and fast technique imaging had to be utilized).  No acute infarct.  Left cerebellar 5 x 4.4 x 3.5 cm mass with calcification/hemorrhage and partial necrosis with surrounding vasogenic edema is causing significant compression of the fourth ventricle, left aspect of the medulla, left middle cerebellar peduncle and left superior cerebellar peduncle. Minimal downward displacement of the left cerebellar peduncle. Slight ventricular prominence without significant hydrocephalus although the patient is at risk for development of such.  Findings highly concerning for solitary metastatic lesion with primary brain tumor a secondary less likely consideration.  Left lenticular nucleus/ mid corona radiata developmental venous anomaly incidentally noted.  Major intracranial vascular structures are patent.  Minimal partial opacification inferior right mastoid air cells. Minimal mucosal thickening paranasal sinuses.  Orbital structures unremarkable.  IMPRESSION: Large left cerebellar lesion suspicious for metastatic disease with surrounding vasogenic edema and significant mass effect placing patient at high risk for development of hydrocephalus. Please see above.  These results were called by telephone at the time of interpretation on 03/18/2015 at 12:25 pm to Dr. ELanelle Bal, who verbally acknowledged these results.   Electronically Signed   By: SGenia DelM.D.   On: 03/18/2015 12:32   Nm Pet Image Initial (pi) Skull Base To Thigh  03/18/2015   ADDENDUM REPORT: 03/18/2015 12:40  ADDENDUM: Today's brain MRI demonstrates a large left cerebellar metastasis. This is visible on the PET-CT, demonstrating central decreased metabolic activity with a rim of  hypermetabolic activity.   Electronically Signed   By: WRichardean SaleM.D.   On: 03/18/2015 12:40   03/18/2015   CLINICAL DATA:  Initial treatment strategy for left upper lobe lung mass, probable adenocarcinoma on biopsy 03/10/2015.  EXAM: NUCLEAR MEDICINE PET SKULL BASE TO THIGH  TECHNIQUE: 5.22 mCi F-18 FDG was injected intravenously. Full-ring PET imaging was performed from the skull base to thigh after the radiotracer. CT data was obtained and used for attenuation correction and anatomic localization.  FASTING BLOOD GLUCOSE:  Value: 96 mg/dl  COMPARISON:  Chest CT 03/04/2015.  FINDINGS: NECK  No hypermetabolic cervical lymph nodes are identified.There are no lesions of the pharyngeal mucosal space.  CHEST  The large central left upper lobe mass causing complete lobar collapse is markedly hypermetabolic. The lesion has an SUV max of 32. It has multiple lobulations extending into  the mediastinum, periphery of the collapsed left upper lobe and potentially the anterior chest wall near the left first costomanubrial junction. The hypermetabolic component measures approximately 10.2 x 7.6 cm transverse. Tumor extends into the pre-vascular space and encases the left hilum. Separate from the mass, there are hypermetabolic prevascular and precarinal lymph nodes. The aerated lungs demonstrate moderate emphysematous changes. There are no suspicious nodules within the right lung or left lower lobe. There is a small left pleural effusion without abnormal involving CT activity. Patient is status post median sternotomy. There is diffuse atherosclerosis of the aorta, great vessels and coronary arteries.  ABDOMEN/PELVIS  There is no hypermetabolic activity within the liver, adrenal glands, spleen or pancreas. There is no hypermetabolic nodal activity. The right kidney demonstrates diffuse cortical thinning and moderate pelvicaliceal dilatation. There is no high-grade obstruction. The urinary bladder is distended. There is an  abdominal aortic aneurysm measuring up to 3.1 x 2.7 cm transverse.  SKELETON  There is a hypermetabolic lesion involving the inferior right aspect of the sacrum associated with a small soft tissue component best seen on axial image 171. This has an SUV max of 9.5 and is consistent with a metastasis. There is an additional possible small metastasis involving the left T9 pedicle near in the costovertebral junction. There is suspicion of osseous extension involving the left manubrium adjacent to the articulation with the left first rib. Right pelvic muscular atrophy noted related to previous right lower extremity amputation.  IMPRESSION: 1. The large central left upper lobe mass is markedly hypermetabolic. This mass is multilobulated with mediastinal and possible chest wall extension. Separate from the lesion, there are several hypermetabolic nodal metastases within the anterior mediastinum and precarinal stations. 2. Inferior right sacral metastasis with possible left T9 pedicle and left manubrial metastases/ direct extension. 3. No distant non osseous metastases identified.  Electronically Signed: By: Richardean Sale M.D. On: 03/18/2015 09:50   Ct Biopsy  03/10/2015   CLINICAL DATA:  Lung mass  EXAM: CT-GUIDED BIOPSY LEFT UPPER LOBE LUNG MASS  MEDICATIONS AND MEDICAL HISTORY: Versed 1.5 mg, Fentanyl 25 mcg.  Additional Medications: None.  ANESTHESIA/SEDATION: Moderate sedation time: 18 minutes  PROCEDURE: The procedure, risks, benefits, and alternatives were explained to the patient. Questions regarding the procedure were encouraged and answered. The patient understands and consents to the procedure.  The anterior upper left thorax was prepped with Betadine in a sterile fashion, and a sterile drape was applied covering the operative field. A sterile gown and sterile gloves were used for the procedure.  Under CT guidance, a(n) 17 gauge guide needle was advanced into the left upper lobe lung mass. Subsequently four  18 gauge core biopsies were obtained. The guide needle was removed. Final imaging was performed.  Patient tolerated the procedure well without complication. Vital sign monitoring by nursing staff during the procedure will continue as patient is in the special procedures unit for post procedure observation.  FINDINGS: The images document guide needle placement within the left upper lobe lung mass. Post biopsy images demonstrate no hemorrhage.  COMPLICATIONS: None  IMPRESSION: Successful CT-guided core biopsy of a left upper lobe lung mass.   Electronically Signed   By: Marybelle Killings M.D.   On: 03/10/2015 12:47   Dg Chest Port 1 View  03/12/2015   CLINICAL DATA:  Lung carcinoma with chest pain  EXAM: PORTABLE CHEST - 1 VIEW  COMPARISON:  March 10, 2015  FINDINGS: There is a mass with postobstructive pneumonitis involving much of the  left upper lobe. Lungs elsewhere are clear. Heart size and pulmonary vascular normal. This mass extends into the region of the left hilum with stable fullness in the left hilum. No pneumothorax. No bone lesions. Patient is status post coronary artery bypass grafting.  IMPRESSION: No pneumothorax. Persistent mass with volume loss left upper lobe. No new opacity. No change in cardiac silhouette.   Electronically Signed   By: Lowella Grip III M.D.   On: 03/12/2015 13:18    ASSESSMENT: This is a very pleasant 54 years old white male recently diagnosed with stage IV (T3, N2, M1 B) non-small cell lung cancer, adenocarcinoma presented with a large left upper lobe lung mass with mediastinal lymphadenopathy and large solitary metastatic brain lesion, diagnosed in June 2016.   PLAN: I had a lengthy discussion with the patient and his sister today about his current disease stage, prognosis and treatment options. I recommended for the patient to see Dr. Sondra Come for evaluation and management of the large solitary brain lesion. This may require surgical resection followed by brain radiation. I  also started the patient on Decadron 4 mg by mouth every 6 hours because of the vasogenic edema. This will be tapered by radiation oncology doing his treatment. I also requested his tissue to be sent to [facility] one for molecular study but this is expected to take 3 more weeks. I discussed with the patient his treatment options including palliative care and hospice referral versus consideration of systemic chemotherapy if he has no target mutation. The patient is interested in systemic chemotherapy. I will arrange for him to come back for follow-up visit in 2-3 weeks for reevaluation and more detailed discussion of his systemic therapy once we complete the required treatment for the brain lesion. For pain management he will continue on tramadol. The patient was seen during the multidisciplinary thoracic oncology clinic today by medical oncology, radiation oncology, thoracic navigator, social worker as well as physical therapist. We will also refer the patient to the dietitian at the [facility] for evaluation and management of his malnutrition.  The patient voices understanding of current disease status and treatment options and is in agreement with the current care plan.  All questions were answered. The patient knows to call the clinic with any problems, questions or concerns. We can certainly see the patient much sooner if necessary.  Thank you so much for allowing me to participate in the care of Gilbert Williams. I will continue to follow up the patient with you and assist in his care.  I spent 40 minutes counseling the patient face to face. The total time spent in the appointment was 60 minutes.  Disclaimer: This note was dictated with voice recognition software. Similar sounding words can inadvertently be transcribed and may not be corrected upon review.   Scotlyn Mccranie K. March 18, 2015, 1:52 PM

## 2015-03-18 NOTE — Progress Notes (Addendum)
Radiation Oncology         [phone]) [phone] ________________________________  Initial Outpatient Consultation  Name: Gilbert Williams MRN: [id number]  Date: 03/18/2015  DOB: June 25, 1961  BT:DVVOH, Maryjo Rochester, MD  Grace Isaac, MD   REFERRING PHYSICIAN: Grace Isaac, MD  DIAGNOSIS: Stage IV non-small cell lung cancer with osseous and brain metastasis  HISTORY OF PRESENT ILLNESS::Gilbert Williams is a 54 y.o. male who is presenting to the multidisciplinary clinic at the courtesy of Dr. Ceasar Mons. He pt presented to Dr. Servando Snare with sternal discomfort on 02/23/15. Dr. Servando Snare did a chest X-ray at that time. The pt thought the discomfort resulted from his coronary artery bypass grafting in 2009 at [facility]. He continues to be seen there. The pt notes he had an increased cough over the last month and his family noted his voice started to become hoarse. He also had a 20-25 lb weight loss over the last 3 months. Recently been worked up with left shoulder film and neck films and myelogram for neck and back pain. The pt states he has SOB. Denies coughing blood. Reports pain between his shoulder blades. . Denies double vision, headaches, and nausea. His sister denies mood changes, but states he does not sleep well. He is able to drive himself and denies difficulty seeing while driving. He reports he eats well. His sister reports that his physicians took him off of his blood pressure medication. Pt reports he had ? bone cancer in his right leg in 1984 and that led to it being amputated. He reports he had radiation? and chemotherapy. The pt does not wear a prosthetic but uses crutches to walk. Earlier today the patient completed his staging workup with PET scan showing osseous metastasis. More importantly the patient's brain MRI showed a significant metastasis within the left cerebellar area.  Diagnosis was made by needle core biopsy of the left upper lobe. This revealed non-small cell carcinoma,  most consistent with adenocarcinoma.  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    PAST SURGICAL HISTORY: Past Surgical History  Procedure Laterality Date  . Coronary artery bypass graft  2009  . Leg amputation above knee Right 1981    FAMILY HISTORY: family history includes Asthma in his sister; COPD in his mother; Diabetes in his sister; Heart attack in his father; Heart failure in his mother; Hypertension in his sister; Peripheral vascular disease in his sister.  SOCIAL HISTORY:  reports that he has been smoking Cigarettes.  He started smoking about 36 years ago. He has a 36 pack-year smoking history. He has never used smokeless tobacco. He reports that he uses illicit drugs. He reports that he does not drink alcohol. he is able to drive a car despite his right leg amputation.  ALLERGIES: Meperidine and related  MEDICATIONS:  Current Outpatient Prescriptions  Medication Sig Dispense Refill  . ALPRAZolam (XANAX) 0.25 MG tablet Take 0.25 mg by mouth at bedtime.    Marland Kitchen aspirin EC 81 MG tablet Take 81 mg by mouth daily.    . carvedilol (COREG) 12.5 MG tablet     . dexamethasone (DECADRON) 4 MG tablet Take 1 tablet (4 mg total) by mouth 4 (four) times daily. 40 tablet 1  . losartan (COZAAR) 50 MG tablet     . traMADol (ULTRAM) 50 MG  tablet Take 1 tablet (50 mg total) by mouth every 6 (six) hours as needed for moderate pain. 30 tablet 2   No current facility-administered medications for this encounter.    REVIEW OF SYSTEMS:  A 15 point review of systems is documented in the electronic medical record. This was obtained by the nursing staff. However, I reviewed this with the patient to discuss relevant findings and make appropriate changes.  Pertinent items are noted in  HPI.   PHYSICAL EXAM: General: Alert and oriented, in no acute distress Vitals - 1 value per visit 03/11/7857  SYSTOLIC 850  DIASTOLIC 77  Pulse 277  Temperature 97.8  Respirations 20  Weight (lb) 100  Height '5\' 8"'$   BMI 15.21  VISIT REPORT    HEENT: Head is normocephalic. Extraocular movements are intact. Oropharynx is clear. Edentulous . Neck:  No palpable cervical, supraclavicular, or axillary adenopathy. Heart: Regular in rate and rhythm with no murmurs, rubs, or gallops. Chest: Right lung is clear, but decreased breath sounds in the left upper lung field.  Lymphatics: see Neck Exam Skin: No concerning lesions. Musculoskeletal: Symmetric strength in the upper extremities. Denies numbness in his left leg. Gets around in crutches. Right leg is amputated Neurologic:No obvious focal deficits. Speech is fluent. Unstable finger to nose coordination on the left side. Psychiatric: Judgment and insight are intact. Affect is appropriate.    ECOG = 2  2 - Symptomatic, <50% in bed during the day (Ambulatory and capable of all self care but unable to carry out any work activities. Up and about more than 50% of waking hours)  LABORATORY DATA:  Lab Results  Component Value Date   WBC 8.8 03/18/2015   HGB 12.1* 03/18/2015   HCT 36.8* 03/18/2015   MCV 84.8 03/18/2015   PLT 495* 03/18/2015   NEUTROABS 5.9 03/18/2015   Lab Results  Component Value Date   NA 132* 03/18/2015   K 4.2 03/18/2015   CL 95* 03/12/2015   CO2 20* 03/18/2015   GLUCOSE 86 03/18/2015   CREATININE 0.6* 03/18/2015   CALCIUM 9.0 03/18/2015      RADIOGRAPHY: Dg Chest 1 View  03/10/2015   CLINICAL DATA:  Post biopsy  EXAM: CHEST  1 VIEW  COMPARISON:  02/23/2015  FINDINGS: No pneumothorax post biopsy. Left upper lobe mass and left upper lobe collapse are stable. Hyperaeration.  IMPRESSION: No pneumothorax post biopsy.   Electronically Signed   By: Marybelle Killings M.D.   On: 03/10/2015 13:44   Dg Chest 2  View  02/23/2015   CLINICAL DATA:  Sternal pain.  EXAM: CHEST  2 VIEW  COMPARISON:  None.  FINDINGS: Prior CABG. Heart size normal. Pulmonary vascularity normal. Large left suprahilar mass lesion noted with left upper lobe atelectatic changes and pleural thickening. No pleural effusion or pneumothorax. No acute bony abnormality.  IMPRESSION: 1. Large left hilar/ suprahilar mass lesion with left upper lobe atelectasis and pleural thickening. Pulmonary malignancy could present in this fashion. PET-CT should be considered for further evaluation.  2. Prior CABG. Heart size normal. Sternal wires intact. No acute bony abnormality.   Electronically Signed   By: Marcello Moores  Register   On: 02/23/2015 14:21   Ct Chest Wo Contrast  03/04/2015   CLINICAL DATA:  Left suprahilar mass on chest x-ray. Mid chest pain. Smoker. Remote history of right lower extremity bone cancer.  EXAM: CT CHEST WITHOUT CONTRAST  TECHNIQUE: Multidetector CT imaging of the chest was performed following the standard protocol without IV  contrast.  COMPARISON:  Chest radiograph of 02/23/2015. Remote chest CT of 08/11/2003.  FINDINGS: Mediastinum/Nodes: Mild bilateral gynecomastia. Aortic and branch vessel atherosclerosis. Prior median sternotomy. Mild motion degradation. Mild cardiomegaly. Multivessel coronary artery atherosclerosis.  11 mm AP window node on image 26 is suspicious. A precarinal node measures 1.0 cm on image 27. No dominant right hilar adenopathy.  Lungs/Pleura: Small left pleural effusion. Moderate centrilobular emphysema. A Mass replaces the left upper lobe, measuring on the order of 9.5 x 8.6 cm on image 26 of series 6. 10.6 cm craniocaudal. Direct extension to the left hilum and left side of the mediastinum. Example on image 26 of series 3 into the left side of the mediastinum. Left upper lobe in obstruction with significant mass effect on the lingular and left lower lobe bronchi. No gross osseous destruction to confirm chest wall  invasion.  Satellite parenchymal or pleural-based nodules including at 1.8 and 1.7 cm anteriorly and inferiorly on image 37 of series 3.  Upper abdomen: Motion degradation continuing. Marked right renal atrophy. Grossly normal imaged portions the liver, spleen, stomach, pancreas, adrenal glands. Retroaortic left renal vein.  Musculoskeletal: No acute osseous abnormality.  IMPRESSION: 1. Mass replacing the majority of the left upper lobe with direct mediastinal extension, consistent with primary bronchogenic carcinoma. 2. Enlarged mediastinal nodes are highly suspicious for metastatic disease. Pulmonary parenchymal or pleural based satellite nodules within the inferior left hemi thorax are also malignant. 3. Small left pleural effusion. 4. Degraded exam secondary to motion and lack of IV contrast. 5. Advanced atherosclerosis with centrilobular emphysema. 6. Gynecomastia.   Electronically Signed   By: Abigail Miyamoto M.D.   On: 03/04/2015 15:17   Mr Jeri Cos [place] Contrast  03/18/2015   CLINICAL DATA:  54 year old hypertensive male with history of bladder cancer, bone cancer and recent diagnosis of lung cancer presenting with dizziness and weakness. Subsequent encounter.  EXAM: MRI HEAD WITHOUT AND WITH CONTRAST  TECHNIQUE: Multiplanar, multiecho pulse sequences of the brain and surrounding structures were obtained without and with intravenous contrast.  CONTRAST:  16m MULTIHANCE GADOBENATE DIMEGLUMINE 529 MG/ML IV SOLN  COMPARISON:  PET CT same date.  No comparison brain MR or head CT.  FINDINGS: Exam is motion degraded (patient was claustrophobic and fast technique imaging had to be utilized).  No acute infarct.  Left cerebellar 5 x 4.4 x 3.5 cm mass with calcification/hemorrhage and partial necrosis with surrounding vasogenic edema is causing significant compression of the fourth ventricle, left aspect of the medulla, left middle cerebellar peduncle and left superior cerebellar peduncle. Minimal downward displacement  of the left cerebellar peduncle. Slight ventricular prominence without significant hydrocephalus although the patient is at risk for development of such.  Findings highly concerning for solitary metastatic lesion with primary brain tumor a secondary less likely consideration.  Left lenticular nucleus/ mid corona radiata developmental venous anomaly incidentally noted.  Major intracranial vascular structures are patent.  Minimal partial opacification inferior right mastoid air cells. Minimal mucosal thickening paranasal sinuses.  Orbital structures unremarkable.  IMPRESSION: Large left cerebellar lesion suspicious for metastatic disease with surrounding vasogenic edema and significant mass effect placing patient at high risk for development of hydrocephalus. Please see above.  These results were called by telephone at the time of interpretation on 03/18/2015 at 12:25 pm to Dr. ELanelle Bal, who verbally acknowledged these results.   Electronically Signed   By: SGenia DelM.D.   On: 03/18/2015 12:32   Nm Pet Image Initial (pi) Skull Base  To Thigh  03/18/2015   ADDENDUM REPORT: 03/18/2015 12:40  ADDENDUM: Today's brain MRI demonstrates a large left cerebellar metastasis. This is visible on the PET-CT, demonstrating central decreased metabolic activity with a rim of hypermetabolic activity.   Electronically Signed   By: Richardean Sale M.D.   On: 03/18/2015 12:40   03/18/2015   CLINICAL DATA:  Initial treatment strategy for left upper lobe lung mass, probable adenocarcinoma on biopsy 03/10/2015.  EXAM: NUCLEAR MEDICINE PET SKULL BASE TO THIGH  TECHNIQUE: 5.22 mCi F-18 FDG was injected intravenously. Full-ring PET imaging was performed from the skull base to thigh after the radiotracer. CT data was obtained and used for attenuation correction and anatomic localization.  FASTING BLOOD GLUCOSE:  Value: 96 mg/dl  COMPARISON:  Chest CT 03/04/2015.  FINDINGS: NECK  No hypermetabolic cervical lymph nodes are  identified.There are no lesions of the pharyngeal mucosal space.  CHEST  The large central left upper lobe mass causing complete lobar collapse is markedly hypermetabolic. The lesion has an SUV max of 32. It has multiple lobulations extending into the mediastinum, periphery of the collapsed left upper lobe and potentially the anterior chest wall near the left first costomanubrial junction. The hypermetabolic component measures approximately 10.2 x 7.6 cm transverse. Tumor extends into the pre-vascular space and encases the left hilum. Separate from the mass, there are hypermetabolic prevascular and precarinal lymph nodes. The aerated lungs demonstrate moderate emphysematous changes. There are no suspicious nodules within the right lung or left lower lobe. There is a small left pleural effusion without abnormal involving CT activity. Patient is status post median sternotomy. There is diffuse atherosclerosis of the aorta, great vessels and coronary arteries.  ABDOMEN/PELVIS  There is no hypermetabolic activity within the liver, adrenal glands, spleen or pancreas. There is no hypermetabolic nodal activity. The right kidney demonstrates diffuse cortical thinning and moderate pelvicaliceal dilatation. There is no high-grade obstruction. The urinary bladder is distended. There is an abdominal aortic aneurysm measuring up to 3.1 x 2.7 cm transverse.  SKELETON  There is a hypermetabolic lesion involving the inferior right aspect of the sacrum associated with a small soft tissue component best seen on axial image 171. This has an SUV max of 9.5 and is consistent with a metastasis. There is an additional possible small metastasis involving the left T9 pedicle near in the costovertebral junction. There is suspicion of osseous extension involving the left manubrium adjacent to the articulation with the left first rib. Right pelvic muscular atrophy noted related to previous right lower extremity amputation.  IMPRESSION: 1. The  large central left upper lobe mass is markedly hypermetabolic. This mass is multilobulated with mediastinal and possible chest wall extension. Separate from the lesion, there are several hypermetabolic nodal metastases within the anterior mediastinum and precarinal stations. 2. Inferior right sacral metastasis with possible left T9 pedicle and left manubrial metastases/ direct extension. 3. No distant non osseous metastases identified.  Electronically Signed: By: Richardean Sale M.D. On: 03/18/2015 09:50   Ct Biopsy  03/10/2015   CLINICAL DATA:  Lung mass  EXAM: CT-GUIDED BIOPSY LEFT UPPER LOBE LUNG MASS  MEDICATIONS AND MEDICAL HISTORY: Versed 1.5 mg, Fentanyl 25 mcg.  Additional Medications: None.  ANESTHESIA/SEDATION: Moderate sedation time: 18 minutes  PROCEDURE: The procedure, risks, benefits, and alternatives were explained to the patient. Questions regarding the procedure were encouraged and answered. The patient understands and consents to the procedure.  The anterior upper left thorax was prepped with Betadine in a sterile fashion, and  a sterile drape was applied covering the operative field. A sterile gown and sterile gloves were used for the procedure.  Under CT guidance, a(n) 17 gauge guide needle was advanced into the left upper lobe lung mass. Subsequently four 18 gauge core biopsies were obtained. The guide needle was removed. Final imaging was performed.  Patient tolerated the procedure well without complication. Vital sign monitoring by nursing staff during the procedure will continue as patient is in the special procedures unit for post procedure observation.  FINDINGS: The images document guide needle placement within the left upper lobe lung mass. Post biopsy images demonstrate no hemorrhage.  COMPLICATIONS: None  IMPRESSION: Successful CT-guided core biopsy of a left upper lobe lung mass.   Electronically Signed   By: Marybelle Killings M.D.   On: 03/10/2015 12:47   Dg Chest Port 1  View  03/12/2015   CLINICAL DATA:  Lung carcinoma with chest pain  EXAM: PORTABLE CHEST - 1 VIEW  COMPARISON:  March 10, 2015  FINDINGS: There is a mass with postobstructive pneumonitis involving much of the left upper lobe. Lungs elsewhere are clear. Heart size and pulmonary vascular normal. This mass extends into the region of the left hilum with stable fullness in the left hilum. No pneumothorax. No bone lesions. Patient is status post coronary artery bypass grafting.  IMPRESSION: No pneumothorax. Persistent mass with volume loss left upper lobe. No new opacity. No change in cardiac silhouette.   Electronically Signed   By: Lowella Grip III M.D.   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 The patient has a large lesion within the left cerebellar area with significant mass effect. The patient's MRI images were reviewed by Dr. Vertell Limber. The patient is felt to be a good candidate for preoperative SRS followed by surgery  PLAN: Due to the pt's brain cancer, we will start on Dexamethasone 4 mg 4 times a day and the pt should start taking it this afternoon. Will schedule the pt for CT sim of the brain later today, pre-op SRS treatment on Monday, and surgery on Tuesday. At a later date, we will do paliative radiation to his chest. He does not appear to be symptomatic from his sacral metastasis at this time.    This document serves as a record of services personally performed by Gery Pray, MD. It was created on his behalf by Darcus Austin, a trained medical scribe. The creation of this record is based on the scribe's personal observations and the provider's statements to them. This document has been checked and approved by the attending provider. ------------------------------------------------  Blair Promise, PhD, MD

## 2015-03-18 NOTE — Telephone Encounter (Signed)
lvm for pt regardign to June appt....mailed pt appt sched/avs and letter

## 2015-03-18 NOTE — Progress Notes (Signed)
  Radiation Oncology         [phone]) [phone] ________________________________  Name: Gilbert Williams  MRN: [id number]  Date: 03/18/2015  DOB: 09-24-1961  SIMULATION AND TREATMENT PLANNING NOTE  DIAGNOSIS:  54 yo male with left cerebella 5.0 cm brain metastasis from Adenocarcinoma of the left upper lobe of the lung.   NARRATIVE:  The patient was brought to the [facility].  Identity was confirmed.  All relevant records and images related to the planned course of therapy were reviewed.  The patient freely provided informed written consent to proceed with treatment after reviewing the details related to the planned course of therapy. The consent form was witnessed and verified by the simulation staff. Intravenous access was established for contrast administration. Then, the patient was set-up in a stable reproducible supine position for radiation therapy.  A relocatable thermoplastic stereotactic head frame was fabricated for precise immobilization.  CT images were obtained.  Surface markings were placed.  The CT images were loaded into the planning software and fused with the patient's targeting MRI scan.  Then the target and avoidance structures were contoured.  Treatment planning then occurred.  The radiation prescription was entered and confirmed.  I have requested 3D planning  I have requested a DVH of the following structures: Brain stem, brain, left eye, right eye, lenses, optic chiasm, target volumes, uninvolved brain, and normal tissue.    SPECIAL TREATMENT PROCEDURE:  The planned course of therapy using radiation constitutes a special treatment procedure. Special care is required in the management of this patient for the following reasons. This treatment constitutes a Special Treatment Procedure for the following reason: High dose per fraction requiring special monitoring for increased toxicities of treatment including daily imaging.  The special nature of the planned course of  radiotherapy will require increased physician supervision and oversight to ensure patient's safety with optimal treatment outcomes.  PLAN:  The patient will receive 15 Gy in 1 fraction, followed by sub-occipital craniectomy and resection.  This document serves as a record of services personally performed by Tyler Pita, MD. It was created on his behalf by Jeralene Peters, a trained medical scribe. The creation of this record is based on the scribe's personal observations and the provider's statements to them. This document has been checked and approved by the attending provider.       ________________________________  Sheral Apley. Tammi Klippel, M.D.

## 2015-03-18 NOTE — Progress Notes (Signed)
Gilbert Williams here for IV start for CT SIM.  Labs from 03/18/15 are BUN of 8.9, cr 0.6.  Patient denies having an allergy to IV contrast dye.  He is not diabetic.  #22 gauge IV started in his left AC.  Blood return noted.  Flushed with 10 cc normal saline.  Secured with tegaderm and tape.  Escorted patient to CT SIM.

## 2015-03-18 NOTE — Progress Notes (Signed)
[facility] Clinical Social Work  Clinical Social Work met with patient/family and Futures trader at [facility] appointment to offer support and assess for psychosocial needs.  Medical oncologist reviewed patient's diagnosis and recommended treatment plan with patient/family.  Gilbert Williams was accompanied by his sister, Gilbert Williams.  He shared her and his neighbor are his main support system.  His mother recently passed away six months go, prior to her passing, they lived together.  Patient and spouse share they are in shock regarding news of brain metastases.  He shared his main concern at this time is "am I going to die?".  Patient's sister shared concerns of patient's lack of appetite, fatigue, and difficulty with daily living activities.  CSW will continue to follow patient through cancer journey.  Clinical Social Work briefly discussed Clinical Social Work role and [employer] support programs/services.  Clinical Social Work encouraged patient to call with any additional questions or concerns.   [name], MSW, LCSW, OSW-C Clinical Social Worker [facility] [phone]

## 2015-03-18 NOTE — Progress Notes (Signed)
IV removed intact by Mont Dutton, [facility] Navigator.

## 2015-03-18 NOTE — Patient Instructions (Addendum)
Peripheral Intravenous Catheter A peripheral intravenous (PIV) catheter is a plastic tube that is inserted into a vein. A PIV catheter can be inserted into a vein in the:  Hands.  Arms.  Feet.  Scalp veins of babies. A PIV catheter is used to give things like:  Fluids.  Antibiotics.  Liquid medicine.  Blood products. RISKS AND COMPLICATIONS  Inflammation of the vein (phlebitis). The vein with the PIV catheter may become warm, swollen, and tender. A red streak in the vein may also be seen.  Medicine leaking into the tissue around the PIV catheter site (infiltration). This can cause swelling, pain, and tissue injury in the vein.  Infection.  Problems with the PIV catheter due to:  The type of medicine given through the PIV catheter.  The location where the PIV catheter is placed.  The PIV catheter becoming dislodged. This may happen due to loose tape or the PIV catheter being pulled.  Medical conditions that may influence how long a PIV catheter lasts. People with diabetes may have problems with a PIV catheter as a result of how diabetes affects the structure of the vein. PIV CATHETER PLACEMENT  Inform your caregiver if you have a hemodialysis shunt or fistula, or you have had a mastectomy or axillary node dissection.  When a vein has been chosen for the PIV catheter, the skin will be cleaned with a germ cleanser. This helps reduces the amount of germs on the skin.  A topical numbing medicine can be placed on the skin before a PIV catheter is started. This can help decrease the pain when the PIV catheter is started.  An elastic band (tourniquet) will be temporarily wrapped around the skin just above the selected vein. This helps enlarge the vein so it is easier to insert the PIV catheter.  A needle in the PIV catheter is then inserted through the skin and into the vein. After the PIV catheter is inserted into the vein, the needle is taken out and the hollow, plastic tube  remains. The tourniquet is then taken off.  The PIV catheter is flushed with a liquid called normal saline. This is done to make sure the PIV catheter works.  Clear plastic tape or an anchor securement device is used to hold the PIV catheter in place. PIV CATHETER CARE  A PIV catheter needs to be changed every 3 to 4 days. This is done to avoid infection.  Do not get the PIV catheter bandage (dressing) wet. Your caregiver can wrap the PIV catheter if you want to take a shower.  Do not pull on the PIV catheter or tubing. This can dislodge the PIV catheter from the vein. TELL YOUR NURSE RIGHT AWAY IF:  You have pain, redness, swelling, or leakage where the PIV catheter is located.  The dressing or tape is loose on your PIV catheter.  The PIV catheter falls out.  The PIV catheter or tubing was pulled. Document Released: 02/27/2011 Document Revised: 12/18/2011 Document Reviewed: 02/27/2011 [facility] Patient Information 2015 [place], [place]. This information is not intended to replace advice given to you by your health care provider. Make sure you discuss any questions you have with your health care provider.

## 2015-03-18 NOTE — Progress Notes (Signed)
Oncology Nurse Navigator Documentation  Oncology Nurse Navigator Flowsheets 03/18/2015  Referral date to RadOnc/MedOnc   Navigator Encounter Type Clinic/MDC  Patient Visit Type Initial  Treatment Phase Abnormal Scans.  I received a phone call from Dr. Servando Snare regarding abnormal MRI brain.  I notified Dr. Julien Nordmann and Dr. Sondra Come.  Patient was notified of findings and started on steroids.  Dr. Tammi Klippel contacted me to have him come to [place] for [facility] after clinic.  I walked Mr. Dunaj to [place] after clinic and meet up with Manuela Schwartz, the brain navigator.    Barriers/Navigation Needs Education;Financial  Coordination of Care Other  Support Groups/Services Friends and Family  Time Spent with Patient 30         Thoracic Treatment Summary Name:Gilbert Williams Date:03/18/2015 DOB:1961-08-13 Your Medical Team Medical Oncologist:Dr. Julien Nordmann Radiation Oncologist:Dr. 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  Pathological stage will be determined after surgery.  Staging is based on the size of the tumor, involvement of lymph nodes or not, and whether or not the cancer center has spread. Recommendations Recommendations:  These recommendations are based on information available as of today's consult.  This is subject to change depending further testing or exams. Next Steps Next Step: Medical Oncology will set up follow up appointments  Radiation Oncology will set up follow up appointments 03/18/15 SIM Barriers to Care What do you perceive as a potential barrier that may prevent you from receiving your treatment plan? Education-information given about lung cancer Financial-Financial advocates will follow up with patient as needed.    Resources Given: [employer] booklet given on NSCLC adenocarcinoma lung cancer  Support/Resources Services at [employer].Radonna Ricker [phone]  Fall Risk information     Questions Norton Blizzard, RN BSN Thoracic Oncology Nurse Navigator at [facility] is a nurse navigator that is available to assist you through your cancer journey.  She can answer your questions and/or provide resources regarding your treatment plan, emotional support, or financial concerns.

## 2015-03-18 NOTE — Progress Notes (Signed)
Pt presented to the lab to have blood drawn. He was left accessed by MRI- right antecubital space. Area was clean and dry. IV was D/C'd. Pt did not offer any complaints or concerns. Area was left clean and dry. Minimal bleeding noted from the IV site. Wrapped with a clean dry gauze and coban.

## 2015-03-19 ENCOUNTER — Other Ambulatory Visit: Payer: Self-pay | Admitting: Radiation Oncology

## 2015-03-19 NOTE — Progress Notes (Signed)
  Radiation Oncology         [phone] ________________________________  Stereotactic Treatment Procedure Note  Name: Gilbert Williams MRN: [id number]  Date: 03/22/2015  DOB: 07-23-1961  SPECIAL TREATMENT PROCEDURE   ICD-9-CM ICD-10-CM   1. 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 Metastasis to brain 198.3 C79.31 MR Brain W Wo Contrast     MR Brain W Wo Contrast    3D TREATMENT PLANNING AND DOSIMETRY:  The patient's radiation plan was reviewed and approved by neurosurgery and radiation oncology prior to treatment.  It showed 3-dimensional radiation distributions overlaid onto the planning CT/MRI image set.  The [facility] for the target structures as well as the organs at risk were reviewed. The documentation of the 3D plan and dosimetry are filed in the radiation oncology EMR.  NARRATIVE:  Gilbert Williams was brought to the TrueBeam stereotactic radiation treatment machine and placed supine on the CT couch. The head frame was applied, and the patient was set up for stereotactic radiosurgery.  Neurosurgery was present for the set-up and delivery  SIMULATION VERIFICATION:  In the couch zero-angle position, the patient underwent Exactrac imaging using the Brainlab system with orthogonal KV images.  These were carefully aligned and repeated to confirm treatment position for each of the isocenters.  The Exactrac snap film verification was repeated at each couch angle.  NARRATIVE: Gilbert Williams received stereotactic radiosurgery to the following targets: Left cerebellar 5cm target was treated using 4 Rapid Arc VMAT Beams to a prescription dose of 15 Gy.  ExacTrac Snap verification was performed for each couch angle.  STEREOTACTIC TREATMENT MANAGEMENT:  Following delivery, the patient was transported to nursing in stable condition and monitored for possible acute effects.  Vital signs were recorded BP 103/71 mmHg  Pulse 95  Temp(Src) 97.6 F  (36.4 C) (Oral)  Resp 16  SpO2 100%. The patient tolerated treatment without significant acute effects, and was discharged to home in stable condition.    PLAN: '8mg'$  Dexamethasone now. Pt is nauseous, had a difficult weekend and did not take steroids. Admission to [facility] hospital today for nausea, steroids. Brain resection tomorrow.  Follow-up in one month with DR MANNING.  This document serves as a record of services personally performed by Eppie Gibson, MD. It was created on her behalf by Arlyce Harman, a trained medical scribe. The creation of this record is based on the scribe's personal observations and the provider's statements to them. This document has been checked and approved by the attending provider. ________________________________   Eppie Gibson, MD

## 2015-03-20 DIAGNOSIS — C3412 Malignant neoplasm of upper lobe, left bronchus or lung: Secondary | ICD-10-CM | POA: Diagnosis not present

## 2015-03-22 ENCOUNTER — Telehealth: Payer: Self-pay | Admitting: *Deleted

## 2015-03-22 ENCOUNTER — Inpatient Hospital Stay (HOSPITAL_COMMUNITY)
Admission: RE | Admit: 2015-03-22 | Discharge: 2015-03-29 | DRG: 025 | Disposition: A | Discharge status: Skilled Nursing Facility | Payer: Medicare HMO | Source: Ambulatory Visit | Attending: Neurosurgery | Admitting: Neurosurgery

## 2015-03-22 ENCOUNTER — Inpatient Hospital Stay (HOSPITAL_COMMUNITY): Payer: Medicare HMO

## 2015-03-22 ENCOUNTER — Encounter (HOSPITAL_COMMUNITY): Payer: Self-pay | Admitting: Radiology

## 2015-03-22 ENCOUNTER — Other Ambulatory Visit: Payer: Self-pay

## 2015-03-22 ENCOUNTER — Ambulatory Visit
Admission: RE | Admit: 2015-03-22 | Discharge: 2015-03-22 | Disposition: A | Discharge status: Home / Self Care | Payer: Medicare HMO | Source: Ambulatory Visit | Attending: Radiation Oncology | Admitting: Radiation Oncology

## 2015-03-22 ENCOUNTER — Encounter: Payer: Self-pay | Admitting: Radiation Therapy

## 2015-03-22 ENCOUNTER — Encounter: Payer: Self-pay | Admitting: Radiation Oncology

## 2015-03-22 ENCOUNTER — Ambulatory Visit
Admission: RE | Admit: 2015-03-22 | Discharge: 2015-03-22 | Disposition: A | Discharge status: Home / Self Care | Payer: Medicare HMO | Source: Ambulatory Visit

## 2015-03-22 VITALS — BP 103/71 | HR 95 | Temp 97.6°F | Resp 16

## 2015-03-22 DIAGNOSIS — I10 Essential (primary) hypertension: Secondary | ICD-10-CM | POA: Diagnosis present

## 2015-03-22 DIAGNOSIS — R110 Nausea: Secondary | ICD-10-CM | POA: Diagnosis not present

## 2015-03-22 DIAGNOSIS — E871 Hypo-osmolality and hyponatremia: Secondary | ICD-10-CM | POA: Diagnosis present

## 2015-03-22 DIAGNOSIS — R531 Weakness: Secondary | ICD-10-CM

## 2015-03-22 DIAGNOSIS — I2510 Atherosclerotic heart disease of native coronary artery without angina pectoris: Secondary | ICD-10-CM | POA: Diagnosis present

## 2015-03-22 DIAGNOSIS — C3412 Malignant neoplasm of upper lobe, left bronchus or lung: Secondary | ICD-10-CM | POA: Diagnosis present

## 2015-03-22 DIAGNOSIS — I252 Old myocardial infarction: Secondary | ICD-10-CM

## 2015-03-22 DIAGNOSIS — Z66 Do not resuscitate: Secondary | ICD-10-CM | POA: Diagnosis not present

## 2015-03-22 DIAGNOSIS — G936 Cerebral edema: Secondary | ICD-10-CM | POA: Diagnosis present

## 2015-03-22 DIAGNOSIS — R64 Cachexia: Secondary | ICD-10-CM | POA: Diagnosis present

## 2015-03-22 DIAGNOSIS — E43 Unspecified severe protein-calorie malnutrition: Secondary | ICD-10-CM | POA: Diagnosis present

## 2015-03-22 DIAGNOSIS — Z5189 Encounter for other specified aftercare: Secondary | ICD-10-CM

## 2015-03-22 DIAGNOSIS — Z951 Presence of aortocoronary bypass graft: Secondary | ICD-10-CM

## 2015-03-22 DIAGNOSIS — Z515 Encounter for palliative care: Secondary | ICD-10-CM | POA: Diagnosis not present

## 2015-03-22 DIAGNOSIS — Z833 Family history of diabetes mellitus: Secondary | ICD-10-CM | POA: Diagnosis not present

## 2015-03-22 DIAGNOSIS — Z9889 Other specified postprocedural states: Secondary | ICD-10-CM

## 2015-03-22 DIAGNOSIS — Z681 Body mass index (BMI) 19 or less, adult: Secondary | ICD-10-CM

## 2015-03-22 DIAGNOSIS — F17210 Nicotine dependence, cigarettes, uncomplicated: Secondary | ICD-10-CM | POA: Diagnosis present

## 2015-03-22 DIAGNOSIS — Z825 Family history of asthma and other chronic lower respiratory diseases: Secondary | ICD-10-CM

## 2015-03-22 DIAGNOSIS — Z8551 Personal history of malignant neoplasm of bladder: Secondary | ICD-10-CM | POA: Diagnosis not present

## 2015-03-22 DIAGNOSIS — C7931 Secondary malignant neoplasm of brain: Secondary | ICD-10-CM | POA: Diagnosis present

## 2015-03-22 DIAGNOSIS — Z8249 Family history of ischemic heart disease and other diseases of the circulatory system: Secondary | ICD-10-CM

## 2015-03-22 DIAGNOSIS — G9389 Other specified disorders of brain: Secondary | ICD-10-CM | POA: Diagnosis present

## 2015-03-22 DIAGNOSIS — Z89611 Acquired absence of right leg above knee: Secondary | ICD-10-CM

## 2015-03-22 DIAGNOSIS — Z79899 Other long term (current) drug therapy: Secondary | ICD-10-CM | POA: Diagnosis not present

## 2015-03-22 DIAGNOSIS — E860 Dehydration: Secondary | ICD-10-CM | POA: Diagnosis present

## 2015-03-22 DIAGNOSIS — G939 Disorder of brain, unspecified: Secondary | ICD-10-CM | POA: Diagnosis present

## 2015-03-22 DIAGNOSIS — Z7982 Long term (current) use of aspirin: Secondary | ICD-10-CM | POA: Diagnosis not present

## 2015-03-22 DIAGNOSIS — Z85830 Personal history of malignant neoplasm of bone: Secondary | ICD-10-CM | POA: Diagnosis not present

## 2015-03-22 DIAGNOSIS — Z452 Encounter for adjustment and management of vascular access device: Secondary | ICD-10-CM

## 2015-03-22 LAB — COMPREHENSIVE METABOLIC PANEL
ALT: 9 U/L — AB (ref 17–63)
AST: 22 U/L (ref 15–41)
Albumin: 3.2 g/dL — ABNORMAL LOW (ref 3.5–5.0)
Alkaline Phosphatase: 113 U/L (ref 38–126)
Anion gap: 12 (ref 5–15)
BUN: 8 mg/dL (ref 6–20)
CALCIUM: 9 mg/dL (ref 8.9–10.3)
CHLORIDE: 96 mmol/L — AB (ref 101–111)
CO2: 23 mmol/L (ref 22–32)
Creatinine, Ser: 0.61 mg/dL (ref 0.61–1.24)
GFR calc Af Amer: >60 mL/min (ref >60)
Glucose, Bld: 92 mg/dL (ref 65–99)
Potassium: 4 mmol/L (ref 3.5–5.1)
SODIUM: 131 mmol/L — AB (ref 135–145)
Total Bilirubin: 1 mg/dL (ref 0.3–1.2)
Total Protein: 6.7 g/dL (ref 6.5–8.1)

## 2015-03-22 LAB — CBC WITH DIFFERENTIAL/PLATELET
Basophils Absolute: 0 10*3/uL (ref 0.0–0.1)
Basophils Relative: 0 % (ref 0–1)
EOS ABS: 0.1 10*3/uL (ref 0.0–0.7)
EOS PCT: 1 % (ref 0–5)
HCT: 38.6 % — ABNORMAL LOW (ref 39.0–52.0)
Hemoglobin: 13 g/dL (ref 13.0–17.0)
LYMPHS ABS: 0.7 10*3/uL (ref 0.7–4.0)
Lymphocytes Relative: 8 % — ABNORMAL LOW (ref 12–46)
MCH: 28.3 pg (ref 26.0–34.0)
MCHC: 33.7 g/dL (ref 30.0–36.0)
MCV: 84.1 fL (ref 78.0–100.0)
Monocytes Absolute: 0.3 10*3/uL (ref 0.1–1.0)
Monocytes Relative: 3 % (ref 3–12)
NEUTROS PCT: 88 % — AB (ref 43–77)
Neutro Abs: 8.5 10*3/uL — ABNORMAL HIGH (ref 1.7–7.7)
PLATELETS: 482 10*3/uL — AB (ref 150–400)
RBC: 4.59 MIL/uL (ref 4.22–5.81)
RDW: 16.4 % — AB (ref 11.5–15.5)
WBC: 9.5 10*3/uL (ref 4.0–10.5)

## 2015-03-22 LAB — PROTIME-INR
INR: 1.22 (ref 0.00–1.49)
Prothrombin Time: 15.6 seconds — ABNORMAL HIGH (ref 11.6–15.2)

## 2015-03-22 LAB — APTT: aPTT: 33 seconds (ref 24–37)

## 2015-03-22 MED ORDER — PROMETHAZINE HCL 25 MG/ML IJ SOLN
12.5000 mg | Freq: Four times a day (QID) | INTRAMUSCULAR | Status: DC | PRN
Start: 2015-03-22 — End: 2015-03-29

## 2015-03-22 MED ORDER — LORAZEPAM 2 MG/ML IJ SOLN
0.5000 mg | INTRAMUSCULAR | Status: DC | PRN
  Administered 2015-03-22 – 2015-03-26 (×3): 0.5 mg via INTRAVENOUS
  Filled 2015-03-22 (×3): qty 1

## 2015-03-22 MED ORDER — FOLIC ACID 5 MG/ML IJ SOLN
1.0000 mg | Freq: Every day | INTRAMUSCULAR | Status: DC
  Administered 2015-03-22 – 2015-03-28 (×7): 1 mg via INTRAVENOUS
  Filled 2015-03-22 (×7): qty 0.2

## 2015-03-22 MED ORDER — ALUM & MAG HYDROXIDE-SIMETH 200-200-20 MG/5ML PO SUSP: 30.0000 mL | Freq: Four times a day (QID) | ORAL | Status: DC | PRN

## 2015-03-22 MED ORDER — MORPHINE SULFATE 2 MG/ML IJ SOLN
1.0000 mg | INTRAMUSCULAR | Status: DC | PRN
  Administered 2015-03-23 – 2015-03-25 (×2): 1 mg via INTRAVENOUS
  Filled 2015-03-22 (×3): qty 1

## 2015-03-22 MED ORDER — ACETAMINOPHEN 325 MG PO TABS: 650.0000 mg | ORAL_TABLET | ORAL | Status: DC | PRN

## 2015-03-22 MED ORDER — DEXAMETHASONE SODIUM PHOSPHATE 4 MG/ML IJ SOLN
6.0000 mg | Freq: Four times a day (QID) | INTRAMUSCULAR | Status: DC
  Filled 2015-03-22: qty 2

## 2015-03-22 MED ORDER — DEXAMETHASONE SODIUM PHOSPHATE 4 MG/ML IJ SOLN
4.0000 mg | Freq: Four times a day (QID) | INTRAMUSCULAR | Status: DC
  Administered 2015-03-22 – 2015-03-24 (×8): 4 mg via INTRAVENOUS
  Filled 2015-03-22 (×8): qty 1

## 2015-03-22 MED ORDER — SODIUM CHLORIDE 0.9 % IV SOLN: 4.0000 mg | Freq: Four times a day (QID) | INTRAVENOUS | Status: DC

## 2015-03-22 MED ORDER — DEXAMETHASONE 4 MG PO TABS
8.0000 mg | ORAL_TABLET | Freq: Once | ORAL | Status: AC
  Administered 2015-03-22: 8 mg via ORAL
  Filled 2015-03-22: qty 2

## 2015-03-22 MED ORDER — SODIUM CHLORIDE 0.9 % IV SOLN
INTRAVENOUS | Status: DC
  Administered 2015-03-22 – 2015-03-24 (×3): via INTRAVENOUS

## 2015-03-22 MED ORDER — THIAMINE HCL 100 MG/ML IJ SOLN
100.0000 mg | Freq: Every day | INTRAMUSCULAR | Status: DC
  Administered 2015-03-22 – 2015-03-28 (×7): 100 mg via INTRAVENOUS
  Filled 2015-03-22: qty 1
  Filled 2015-03-22: qty 2
  Filled 2015-03-22: qty 1
  Filled 2015-03-22 (×4): qty 2

## 2015-03-22 MED ORDER — ONDANSETRON HCL 4 MG/2ML IJ SOLN
4.0000 mg | Freq: Four times a day (QID) | INTRAMUSCULAR | Status: DC | PRN
  Administered 2015-03-24: 4 mg via INTRAVENOUS

## 2015-03-22 MED ORDER — GADOBENATE DIMEGLUMINE 529 MG/ML IV SOLN
10.0000 mL | Freq: Once | INTRAVENOUS | Status: AC | PRN
  Administered 2015-03-22: 10 mL via INTRAVENOUS

## 2015-03-22 NOTE — Progress Notes (Signed)
Nurse monitoring complete. Patient's condition unchanged. Patient and his sister are heading to [facility] for admission to [place].

## 2015-03-22 NOTE — Progress Notes (Signed)
Received patient in nursing following pre-op SRS treatment. Per Dr. Pearlie Oyster order administered Decadron 8 mg po. Patient reports he only slept for one hour last night. Reports a headache and nausea. Vitals stable. Will continue to monitor patient while Mont Dutton, RT arranges hospital admission.

## 2015-03-22 NOTE — Consult Note (Signed)
Physical Exam

## 2015-03-22 NOTE — Progress Notes (Signed)
Informed that the patient was briefly seen by Wadie Lessen, NP prior [facility]. Mary verbalized to this RN that she plans to follow up with the patient tomorrow on [place].

## 2015-03-22 NOTE — Progress Notes (Signed)
Patient admitted to 4N06. VSS, see vitals. Neuro assessment stable, patient denies any weakness, numbness, or tingling. Patient complains of severe headache, patient will not rate pain or describe pain to RN. Patient oriented to room. MD paged.

## 2015-03-22 NOTE — Op Note (Signed)
  Name: Gilbert Williams  MRN: [id number]  Date: 03/22/2015   DOB: Apr 11, 1961  Stereotactic Radiosurgery Operative Note  PRE-OPERATIVE DIAGNOSIS:  Solitary Brain Metastasis  POST-OPERATIVE DIAGNOSIS:  Solitary Brain Metastasis  PROCEDURE:  Stereotactic Radiosurgery  SURGEON:  Peggyann Shoals, MD  NARRATIVE: The patient underwent a radiation treatment planning session in the radiation oncology simulation suite under the care of the radiation oncology physician and physicist.  I participated closely in the radiation treatment planning afterwards. The patient underwent planning CT which was fused to 3T high resolution MRI with 1 mm axial slices.  These images were fused on the planning system.  Radiation oncology contoured the gross target volume and subsequently expanded this to yield the Planning Target Volume. I actively participated in the planning process.  I helped to define and review the target contours and also the contours of the optic pathway, eyes, brainstem and selected nearby organs at risk.  All the dose constraints for critical structures were reviewed and compared to AAPM Task Group 101.  The prescription dose conformity was reviewed.  I approved the plan electronically.    Accordingly, Gilbert Williams was brought to the TrueBeam stereotactic radiation treatment linac and placed in the custom immobilization mask.  The patient was aligned according to the IR fiducial markers with BrainLab Exactrac, then orthogonal x-rays were used in ExacTrac with the 6DOF robotic table and the shifts were made to align the patient  This lesion was complex because it was 3.5 cm or greater, adjacent (within 19m) of the optic nerve, optic chasm, optic tract, and/or within the brainstem is complex  FTheresia Boughreceived stereotactic radiosurgery uneventfully.  The detailed description of the procedure is recorded in the radiation oncology procedure note.  I was present for the duration of the  procedure.  DISPOSITION:  Following delivery, the patient was transported to nursing in stable condition and monitored for possible acute effects to be discharged to home in stable condition with follow-up in one month.  SPeggyann Shoals MD 03/22/2015 8:09 AM

## 2015-03-22 NOTE — Progress Notes (Signed)
Patient denies pain, nausea, any vomiting, discomfort.

## 2015-03-22 NOTE — Consult Note (Signed)
Reason for Consult:Posterior fossa brain mass Referring Physician: [name] is an 54 y.o. male.  HPI: 43 -year-old male with past medical history of coronary artery disease, status post CABG x 5 in 2009, history of bladder cancer, bone cancer status post right AKA, recently diagnosed with lung cancer, recent hospitalization earlier in June 2016 for hypotension, dizziness. Patient follows outpatient Dr. Julien Williams and the initial plan was to begin chemotherapy however MRI of the brain has revealed left cerebellar mass with partial necrosis and surrounding vasogenic edema. Patient was started on Decadron outpatient and the plan was to proceed with stereotactic radiology and possible surgery. Patient reports he was not able to take Decadron because of nausea, poor by mouth intake. I recommended that patient be admitted for hydration with fluids and administration of IV Decadron and surgical intervention is tentatively planned for 03/24/2015.     Past Medical History  Diagnosis Date  . Coronary artery disease   . Hypertension   . Bladder cancer     x2  . Myocardial infarction 2009  . Lung cancer   . Bone cancer     Past Surgical History  Procedure Laterality Date  . Coronary artery bypass graft  2009  . Leg amputation above knee Right 1981  . Heart stent      Family History  Problem Relation Age of Onset  . Heart failure Mother     Died at 15  . COPD Mother   . Heart attack Father     Died at 62  . Peripheral vascular disease Sister   . Asthma Sister   . Hypertension Sister   . Diabetes Sister     Social History:  reports that he has been smoking Cigarettes.  He started smoking about 36 years ago. He has a 36 pack-year smoking history. He has never used smokeless tobacco. He reports that he uses illicit drugs. He reports that he does not drink alcohol.  Allergies:  Allergies  Allergen Reactions  . Meperidine And Related Other (See Comments)    Demerol////Happened  with his amputation surgery,was told he had a bad reaction     Medications: I have reviewed the patient's current medications.  Results for orders placed or performed during the hospital encounter of 03/22/15 (from the past 48 hour(s))  CBC with Differential/Platelet     Status: Abnormal   Collection Time: 03/22/15 12:22 PM  Result Value Ref Range   WBC 9.5 4.0 - 10.5 K/uL   RBC 4.59 4.22 - 5.81 MIL/uL   Hemoglobin 13.0 13.0 - 17.0 g/dL   HCT 38.6 (L) 39.0 - 52.0 %   MCV 84.1 78.0 - 100.0 fL   MCH 28.3 26.0 - 34.0 pg   MCHC 33.7 30.0 - 36.0 g/dL   RDW 16.4 (H) 11.5 - 15.5 %   Platelets 482 (H) 150 - 400 K/uL   Neutrophils Relative % 88 (H) 43 - 77 %   Neutro Abs 8.5 (H) 1.7 - 7.7 K/uL   Lymphocytes Relative 8 (L) 12 - 46 %   Lymphs Abs 0.7 0.7 - 4.0 K/uL   Monocytes Relative 3 3 - 12 %   Monocytes Absolute 0.3 0.1 - 1.0 K/uL   Eosinophils Relative 1 0 - 5 %   Eosinophils Absolute 0.1 0.0 - 0.7 K/uL   Basophils Relative 0 0 - 1 %   Basophils Absolute 0.0 0.0 - 0.1 K/uL  Comprehensive metabolic panel     Status: Abnormal   Collection  Time: 03/22/15 12:22 PM  Result Value Ref Range   Sodium 131 (L) 135 - 145 mmol/L   Potassium 4.0 3.5 - 5.1 mmol/L   Chloride 96 (L) 101 - 111 mmol/L   CO2 23 22 - 32 mmol/L   Glucose, Bld 92 65 - 99 mg/dL   BUN 8 6 - 20 mg/dL   Creatinine, Ser 0.61 0.61 - 1.24 mg/dL   Calcium 9.0 8.9 - 10.3 mg/dL   Total Protein 6.7 6.5 - 8.1 g/dL   Albumin 3.2 (L) 3.5 - 5.0 g/dL   AST 22 15 - 41 U/L   ALT 9 (L) 17 - 63 U/L   Alkaline Phosphatase 113 38 - 126 U/L   Total Bilirubin 1.0 0.3 - 1.2 mg/dL   GFR calc non Af Amer >60 >60 mL/min   GFR calc Af Amer >60 >60 mL/min    Comment: (NOTE) The eGFR has been calculated using the CKD EPI equation. This calculation has not been validated in all clinical situations. eGFR's persistently <60 mL/min signify possible Chronic Kidney Disease.    Anion gap 12 5 - 15  Protime-INR     Status: Abnormal    Collection Time: 03/22/15 12:22 PM  Result Value Ref Range   Prothrombin Time 15.6 (H) 11.6 - 15.2 seconds   INR 1.22 0.00 - 1.49  APTT     Status: None   Collection Time: 03/22/15 12:22 PM  Result Value Ref Range   aPTT 33 24 - 37 seconds    No results found.  Review of Systems  Constitutional: Positive for weight loss.  HENT: Negative.   Eyes: Negative.   Respiratory: Positive for wheezing. Negative for cough and shortness of breath.   Cardiovascular: Negative.   Gastrointestinal: Positive for nausea.  Genitourinary: Negative.   Musculoskeletal: Negative.   Skin: Negative.   Neurological: Positive for weakness.  Endo/Heme/Allergies: Negative.   Psychiatric/Behavioral: Negative.    Blood pressure 94/68, pulse 79, temperature 98.2 F (36.8 C), temperature source Oral, resp. rate 16, SpO2 97 %. Physical Exam  Constitutional: He appears cachectic. He is cooperative.  HENT:  Head: Normocephalic and atraumatic.  Right Ear: Hearing and external ear normal.  Left Ear: Hearing and external ear normal.  Nose: Nose normal.  Mouth/Throat: Uvula is midline, oropharynx is clear and moist and mucous membranes are normal.  Eyes: Conjunctivae and EOM are normal. Pupils are equal, round, and reactive to light.  Neck: Trachea normal and normal range of motion. Neck supple.  Cardiovascular: Normal rate, regular rhythm and normal heart sounds.   Respiratory: Effort normal. He has rhonchi in the left middle field.  GI: Soft. Normal appearance. There is no tenderness.  Musculoskeletal:  Right leg amputation  Neurological: He is alert. He has normal strength and normal reflexes. No cranial nerve deficit or sensory deficit. GCS eye subscore is 4. GCS verbal subscore is 5. GCS motor subscore is 6.    Assessment/Plan:  Solitary 5 cm left cerebellar brain mass / metastatic lung cancer - Pt with known history of lung cancer and now with brain mass - Per Dr. Stern of neurosurgery plan is for  surgical intervention on 03/23/2015. - Continue hydration with IV fluids - Continue IV Decadron 4 mg every 6 hours scheduled  Active Problems: Dehydration with hyponatremia / Nausea without vomiting - Likely secondary to GI losses. - Continue supportive care with IV fluids, on T medics as needed  Severe protein-calorie malnutrition - In the context of chronic illness. - We'll   continue hydration for now. Nutrition consulted.  Patient will get preoperative MRI for Navigation.  He tolerated SRS today at [facility].  STERN,JOSEPH D 03/22/2015, 8:08 PM      

## 2015-03-22 NOTE — H&P (Signed)
Patient was seen, examined, treatment plan was discussed with the Physician extender. I have directly reviewed the clinical findings, lab, imaging studies and management of this patient in detail. I have made the necessary changes to the above noted documentation, and agree with the documentation, as recorded by the Physician extender.  Brief narrative: 54 -year-old male with past medical history of coronary artery disease, status post CABG x 5 in 2009, history of bladder cancer, bone cancer status post right AKA, recently diagnosed with lung cancer, recent hospitalization earlier in June 2016 for hypotension, dizziness. Patient follows outpatient Dr. Julien Williams and the initial plan was to begin chemotherapy however MRI of the brain has revealed left cerebellar mass with partial necrosis and surrounding vasogenic edema. Patient was started on Decadron outpatient and the plan was to proceed with stereotactic radiology and possible surgery. Patient reports he was not able to take Decadron because of nausea, poor by mouth intake. Dr. Vertell Williams of neurosurgery has recommended that patient be admitted for hydration with fluids and administration of IV Decadron and surgical intervention is tentatively planned for 03/23/2015.   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 Vertell Williams of neurosurgery plan is for surgical intervention on 03/23/2015. - Continue hydration with IV fluids - Continue IV Decadron 4 mg every 6 hours scheduled  Active Problems: Dehydration with hyponatremia / Nausea without vomiting - Likely secondary to GI losses. - Continue supportive care with IV fluids, on T medics as needed  Severe protein-calorie malnutrition - In the context of chronic illness. - We'll continue hydration for now. Nutrition consulted.   Gilbert Williams [facility] [phone]  *For further  details please refer to admission note done by physician extender below.    [facility] History and Physical                                                                                    Gilbert Williams, is a 54 y.o. male  MRN: [id number]   DOB - Jul 24, 1961  Admit Date - 03/22/2015  Outpatient Primary MD for the patient is Gilbert Gerald, MD  Referring MD: Gilbert Williams / ER  Consulting MD: Palliative Medicine; Dr. Isidore Williams [facility]; Dr. Julien Williams / Oncology  With History of -  Past Medical History  Diagnosis Date  . Coronary artery disease   . Hypertension   . Bladder cancer     x2  . Myocardial infarction 2009  . Lung cancer   . Bone cancer       Past Surgical History  Procedure Laterality Date  . Coronary artery bypass graft  2009  . Leg amputation above knee Right 1981    in for intractable nausea without vomiting in setting of metastatic brain lesion    HPI This is a 54 year old male patient prior history of circumflex, right lower extremity in 1984 requiring of dictation and chemotherapy. He has a has a history of CAD and is status post CABG 5 in 2009. Patient began developing respiratory symptoms with weight loss in May 2016 so he  initially notified Dr. Servando Snare with cardiac surgery who obtained a chest x-ray. This unfortunately revealed a very large left hilar/suprahilar mass lesion. CT of the chest confirmed a 9.5 x 8.6 x 10.6 cm left upper lobe lesion. On June 1 patient underwent CT-guided biopsy of the lung mass and this revealed a non-small cell carcinoma consistent with adenocarcinoma stage IV. Patient has been followed as an outpatient by Dr. Julien Williams with plans to begin chemotherapy. MRI of the brain unfortunately revealed a left cerebellar 5 x 4 x 3 cm mass with calcification/hemorrhage and partial necrosis with surrounding vasogenic edema causing significant compression of the fourth ventricle, left aspect of the medulla, left middle cerebral peduncle, left  superior cerebral peduncle with minimal downward displacement of the left cerebral peduncle. Patient was recently started on oral Decadron as an outpatient with plans to proceed with stereotactic radiology and possible surgical intervention. He was seen today 6/13 in combination by Dr. Lanell Persons (radiation oncology) and Dr. Vertell Williams (neurosurgery) to proceed with stereotactic radiation treatment. Patient informed his providers that he had been unable to take his Decadron nor eat or drink much of anything because of persistent nausea. Dr. Vertell Williams has opted to have the patient admitted to the hospital for IV fluids and IV Decadron as well as other symptom management. In addition surgical intervention is planned for 6/14.  In talking with the patient he reiterates near constant nausea without emesis and inability to eat or drink for at least one week. He has not noticed any blood when he goes to the bathroom, he has not had any abdominal pain, he's not had any fevers or chills, he denies shortness of breath. Patient was interviewed and examined on [address] noting all the lights are out in the room. Of note patient was recently discharged from [facility] on 6/4 related to symptomatic hypotension opting discontinuation of carvedilol and Cozaar at discharge.   Review of Systems   In addition to the HPI above,  No Fever-chills No Headache, changes with Vision or hearing, new weakness, tingling, numbness in any extremity, No problems swallowing food or Liquids, indigestion/reflux -persistent nausea without vomiting only No Chest pain, Cough or Shortness of Breath, palpitations, orthopnea or DOE No Abdominal pain, no melena or hematochezia, no dark tarry stools, Bowel movements are regular, No dysuria, hematuria or flank pain No new skin rashes, lesions, masses or bruises, No new joints pains-aches No polyuria, polydypsia or polyphagia,  *A full 10 point Review of Systems was done, except as stated above, all other  Review of Systems were negative.  Social History History  Substance Use Topics  . Smoking status: Current Every Day Smoker -- 1.00 packs/day for 36 years    Types: Cigarettes    Start date: 02/23/1979  . Smokeless tobacco: Never Used  . Alcohol Use: No     Comment: Quit drinking 6 months ago.    Resides at: Private residence  Lives with: Alone but sister available to help  Ambulatory status: Without assistive devices   Family History Family History  Problem Relation Age of Onset  . Heart failure Mother     Died at 68  . COPD Mother   . Heart attack Father     Died at 58  . Peripheral vascular disease Sister   . Asthma Sister   . Hypertension Sister   . Diabetes Sister      Prior to Admission medications   Medication Sig Start Date End Date Taking? Authorizing Provider  ALPRAZolam Duanne Moron)  0.25 MG tablet Take 0.25 mg by mouth at bedtime.   Yes Historical Provider, MD  aspirin EC 81 MG tablet Take 81 mg by mouth daily.   Yes Historical Provider, MD  carvedilol (COREG) 12.5 MG tablet Take 12.5 mg by mouth 2 (two) times daily with a meal.  03/12/15  Yes Historical Provider, MD  losartan (COZAAR) 50 MG tablet Take 50 mg by mouth daily.  03/12/15  Yes Historical Provider, MD  traMADol (ULTRAM) 50 MG tablet Take 1 tablet (50 mg total) by mouth every 6 (six) hours as needed for moderate pain. 03/04/15  Yes Grace Isaac, MD  dexamethasone (DECADRON) 4 MG tablet Take 1 tablet (4 mg total) by mouth 4 (four) times daily. Patient not taking: Reported on 03/18/2015 03/18/15   Curt Bears, MD    Allergies  Allergen Reactions  . Meperidine And Related Other (See Comments)    Demerol////Happened with his amputation surgery,was told he had a bad reaction     Physical Exam  Vitals  Blood pressure 110/69, pulse 94, temperature 97.8 F (36.6 C), temperature source Oral, resp. rate 15, SpO2 97 %.   General:  In distress as evidenced by persistent nausea and generalized weakness,  appears older than stated age  Psych:  Flat affect, Denies Suicidal or Homicidal ideations, Awake Alert, Oriented X 3. Speech and thought patterns are clear and appropriate, no apparent short term memory deficits  Neuro:   No focal neurological deficits, CN II through XII intact-appear to have some mild nystagmus just on observation but was not tested, Strength 5/5 all 4 extremities, Sensation intact all 4 extremities.  ENT:  Ears and Eyes appear Normal, Conjunctivae clear, PER. Moist oral mucosa without erythema or exudates.  Neck:  Supple, No lymphadenopathy appreciated  Respiratory:  Symmetrical chest wall movement, Good air movement bilaterally, CTAB. Room Air  Cardiac:  RRR, No Murmurs, no LE edema noted, no JVD, No carotid bruits, peripheral pulses palpable at 2+  Abdomen:  Positive bowel sounds, Soft, Non tender, Non distended,  No masses appreciated, no obvious hepatosplenomegaly  Skin:  No Cyanosis, Normal Skin Turgor, No Skin Rash or Bruise.  Extremities: right AKA,  no effusions.  Data Review  CBC  Recent Labs Lab 03/18/15 1230  WBC 8.8  HGB 12.1*  HCT 36.8*  PLT 495*  MCV 84.8  MCH 27.9  MCHC 32.9  RDW 16.6*  LYMPHSABS 1.8  MONOABS 0.9  EOSABS 0.2  BASOSABS 0.0    Chemistries   Recent Labs Lab 03/18/15 1230  NA 132*  K 4.2  CO2 20*  GLUCOSE 86  BUN 8.9  CREATININE 0.6*  CALCIUM 9.0  AST 22  ALT <6  ALKPHOS 123  BILITOT 0.77    estimated creatinine clearance is 70.1 mL/min (by C-G formula based on Cr of 0.6).  No results for input(s): TSH, T4TOTAL, T3FREE, THYROIDAB in the last 72 hours.  Invalid input(s): FREET3  Coagulation profile No results for input(s): INR, PROTIME in the last 168 hours.  No results for input(s): DDIMER in the last 72 hours.  Cardiac Enzymes No results for input(s): CKMB, TROPONINI, MYOGLOBIN in the last 168 hours.  Invalid input(s): CK  Invalid input(s): POCBNP  Urinalysis    Component Value Date/Time    COLORURINE AMBER* 03/12/2015 [place] 03/12/2015 1340   LABSPEC 1.013 03/12/2015 1340   PHURINE 6.0 03/12/2015 1340   GLUCOSEU NEGATIVE 03/12/2015 1340   HGBUR NEGATIVE 03/12/2015 1340   BILIRUBINUR SMALL* 03/12/2015  [place] 03/12/2015 1340   PROTEINUR NEGATIVE 03/12/2015 1340   UROBILINOGEN 2.0* 03/12/2015 1340   NITRITE NEGATIVE 03/12/2015 1340   LEUKOCYTESUR NEGATIVE 03/12/2015 1340    Imaging results:   Dg Chest 1 View  03/10/2015   CLINICAL DATA:  Post biopsy  EXAM: CHEST  1 VIEW  COMPARISON:  02/23/2015  FINDINGS: No pneumothorax post biopsy. Left upper lobe mass and left upper lobe collapse are stable. Hyperaeration.  IMPRESSION: No pneumothorax post biopsy.   Electronically Signed   By: Marybelle Killings M.D.   On: 03/10/2015 13:44   Dg Chest 2 View  02/23/2015   CLINICAL DATA:  Sternal pain.  EXAM: CHEST  2 VIEW  COMPARISON:  None.  FINDINGS: Prior CABG. Heart size normal. Pulmonary vascularity normal. Large left suprahilar mass lesion noted with left upper lobe atelectatic changes and pleural thickening. No pleural effusion or pneumothorax. No acute bony abnormality.  IMPRESSION: 1. Large left hilar/ suprahilar mass lesion with left upper lobe atelectasis and pleural thickening. Pulmonary malignancy could present in this fashion. PET-CT should be considered for further evaluation.  2. Prior CABG. Heart size normal. Sternal wires intact. No acute bony abnormality.   Electronically Signed   By: Marcello Moores  Register   On: 02/23/2015 14:21   Ct Chest Wo Contrast  03/04/2015   CLINICAL DATA:  Left suprahilar mass on chest x-ray. Mid chest pain. Smoker. Remote history of right lower extremity bone cancer.  EXAM: CT CHEST WITHOUT CONTRAST  TECHNIQUE: Multidetector CT imaging of the chest was performed following the standard protocol without IV contrast.  COMPARISON:  Chest radiograph of 02/23/2015. Remote chest CT of 08/11/2003.  FINDINGS: Mediastinum/Nodes: Mild  bilateral gynecomastia. Aortic and branch vessel atherosclerosis. Prior median sternotomy. Mild motion degradation. Mild cardiomegaly. Multivessel coronary artery atherosclerosis.  11 mm AP window node on image 26 is suspicious. A precarinal node measures 1.0 cm on image 27. No dominant right hilar adenopathy.  Lungs/Pleura: Small left pleural effusion. Moderate centrilobular emphysema. A Mass replaces the left upper lobe, measuring on the order of 9.5 x 8.6 cm on image 26 of series 6. 10.6 cm craniocaudal. Direct extension to the left hilum and left side of the mediastinum. Example on image 26 of series 3 into the left side of the mediastinum. Left upper lobe in obstruction with significant mass effect on the lingular and left lower lobe bronchi. No gross osseous destruction to confirm chest wall invasion.  Satellite parenchymal or pleural-based nodules including at 1.8 and 1.7 cm anteriorly and inferiorly on image 37 of series 3.  Upper abdomen: Motion degradation continuing. Marked right renal atrophy. Grossly normal imaged portions the liver, spleen, stomach, pancreas, adrenal glands. Retroaortic left renal vein.  Musculoskeletal: No acute osseous abnormality.  IMPRESSION: 1. Mass replacing the majority of the left upper lobe with direct mediastinal extension, consistent with primary bronchogenic carcinoma. 2. Enlarged mediastinal nodes are highly suspicious for metastatic disease. Pulmonary parenchymal or pleural based satellite nodules within the inferior left hemi thorax are also malignant. 3. Small left pleural effusion. 4. Degraded exam secondary to motion and lack of IV contrast. 5. Advanced atherosclerosis with centrilobular emphysema. 6. Gynecomastia.   Electronically Signed   By: Abigail Miyamoto M.D.   On: 03/04/2015 15:17   Mr Jeri Cos OZ Contrast  03/18/2015   CLINICAL DATA:  54 year old hypertensive male with history of bladder cancer, bone cancer and recent diagnosis of lung cancer presenting with  dizziness and weakness. Subsequent encounter.  EXAM:  MRI HEAD WITHOUT AND WITH CONTRAST  TECHNIQUE: Multiplanar, multiecho pulse sequences of the brain and surrounding structures were obtained without and with intravenous contrast.  CONTRAST:  44m MULTIHANCE GADOBENATE DIMEGLUMINE 529 MG/ML IV SOLN  COMPARISON:  PET CT same date.  No comparison brain MR or head CT.  FINDINGS: Exam is motion degraded (patient was claustrophobic and fast technique imaging had to be utilized).  No acute infarct.  Left cerebellar 5 x 4.4 x 3.5 cm mass with calcification/hemorrhage and partial necrosis with surrounding vasogenic edema is causing significant compression of the fourth ventricle, left aspect of the medulla, left middle cerebellar peduncle and left superior cerebellar peduncle. Minimal downward displacement of the left cerebellar peduncle. Slight ventricular prominence without significant hydrocephalus although the patient is at risk for development of such.  Findings highly concerning for solitary metastatic lesion with primary brain tumor a secondary less likely consideration.  Left lenticular nucleus/ mid corona radiata developmental venous anomaly incidentally noted.  Major intracranial vascular structures are patent.  Minimal partial opacification inferior right mastoid air cells. Minimal mucosal thickening paranasal sinuses.  Orbital structures unremarkable.  IMPRESSION: Large left cerebellar lesion suspicious for metastatic disease with surrounding vasogenic edema and significant mass effect placing patient at high risk for development of hydrocephalus. Please see above.  These results were called by telephone at the time of interpretation on 03/18/2015 at 12:25 pm to Dr. ELanelle Bal, who verbally acknowledged these results.   Electronically Signed   By: SGenia DelM.D.   On: 03/18/2015 12:32   Nm Pet Image Initial (pi) Skull Base To Thigh  03/18/2015   ADDENDUM REPORT: 03/18/2015 12:40  ADDENDUM: Today's  brain MRI demonstrates a large left cerebellar metastasis. This is visible on the PET-CT, demonstrating central decreased metabolic activity with a rim of hypermetabolic activity.   Electronically Signed   By: WRichardean SaleM.D.   On: 03/18/2015 12:40   03/18/2015   CLINICAL DATA:  Initial treatment strategy for left upper lobe lung mass, probable adenocarcinoma on biopsy 03/10/2015.  EXAM: NUCLEAR MEDICINE PET SKULL BASE TO THIGH  TECHNIQUE: 5.22 mCi F-18 FDG was injected intravenously. Full-ring PET imaging was performed from the skull base to thigh after the radiotracer. CT data was obtained and used for attenuation correction and anatomic localization.  FASTING BLOOD GLUCOSE:  Value: 96 mg/dl  COMPARISON:  Chest CT 03/04/2015.  FINDINGS: NECK  No hypermetabolic cervical lymph nodes are identified.There are no lesions of the pharyngeal mucosal space.  CHEST  The large central left upper lobe mass causing complete lobar collapse is markedly hypermetabolic. The lesion has an SUV max of 32. It has multiple lobulations extending into the mediastinum, periphery of the collapsed left upper lobe and potentially the anterior chest wall near the left first costomanubrial junction. The hypermetabolic component measures approximately 10.2 x 7.6 cm transverse. Tumor extends into the pre-vascular space and encases the left hilum. Separate from the mass, there are hypermetabolic prevascular and precarinal lymph nodes. The aerated lungs demonstrate moderate emphysematous changes. There are no suspicious nodules within the right lung or left lower lobe. There is a small left pleural effusion without abnormal involving CT activity. Patient is status post median sternotomy. There is diffuse atherosclerosis of the aorta, great vessels and coronary arteries.  ABDOMEN/PELVIS  There is no hypermetabolic activity within the liver, adrenal glands, spleen or pancreas. There is no hypermetabolic nodal activity. The right kidney  demonstrates diffuse cortical thinning and moderate pelvicaliceal dilatation. There is no high-grade  obstruction. The urinary bladder is distended. There is an abdominal aortic aneurysm measuring up to 3.1 x 2.7 cm transverse.  SKELETON  There is a hypermetabolic lesion involving the inferior right aspect of the sacrum associated with a small soft tissue component best seen on axial image 171. This has an SUV max of 9.5 and is consistent with a metastasis. There is an additional possible small metastasis involving the left T9 pedicle near in the costovertebral junction. There is suspicion of osseous extension involving the left manubrium adjacent to the articulation with the left first rib. Right pelvic muscular atrophy noted related to previous right lower extremity amputation.  IMPRESSION: 1. The large central left upper lobe mass is markedly hypermetabolic. This mass is multilobulated with mediastinal and possible chest wall extension. Separate from the lesion, there are several hypermetabolic nodal metastases within the anterior mediastinum and precarinal stations. 2. Inferior right sacral metastasis with possible left T9 pedicle and left manubrial metastases/ direct extension. 3. No distant non osseous metastases identified.  Electronically Signed: By: Richardean Sale M.D. On: 03/18/2015 09:50   Ct Biopsy  03/10/2015   CLINICAL DATA:  Lung mass  EXAM: CT-GUIDED BIOPSY LEFT UPPER LOBE LUNG MASS  MEDICATIONS AND MEDICAL HISTORY: Versed 1.5 mg, Fentanyl 25 mcg.  Additional Medications: None.  ANESTHESIA/SEDATION: Moderate sedation time: 18 minutes  PROCEDURE: The procedure, risks, benefits, and alternatives were explained to the patient. Questions regarding the procedure were encouraged and answered. The patient understands and consents to the procedure.  The anterior upper left thorax was prepped with Betadine in a sterile fashion, and a sterile drape was applied covering the operative field. A sterile gown and  sterile gloves were used for the procedure.  Under CT guidance, a(n) 17 gauge guide needle was advanced into the left upper lobe lung mass. Subsequently four 18 gauge core biopsies were obtained. The guide needle was removed. Final imaging was performed.  Patient tolerated the procedure well without complication. Vital sign monitoring by nursing staff during the procedure will continue as patient is in the special procedures unit for post procedure observation.  FINDINGS: The images document guide needle placement within the left upper lobe lung mass. Post biopsy images demonstrate no hemorrhage.  COMPLICATIONS: None  IMPRESSION: Successful CT-guided core biopsy of a left upper lobe lung mass.   Electronically Signed   By: Marybelle Killings M.D.   On: 03/10/2015 12:47   Dg Chest Port 1 View  03/12/2015   CLINICAL DATA:  Lung carcinoma with chest pain  EXAM: PORTABLE CHEST - 1 VIEW  COMPARISON:  March 10, 2015  FINDINGS: There is a mass with postobstructive pneumonitis involving much of the left upper lobe. Lungs elsewhere are clear. Heart size and pulmonary vascular normal. This mass extends into the region of the left hilum with stable fullness in the left hilum. No pneumothorax. No bone lesions. Patient is status post coronary artery bypass grafting.  IMPRESSION: No pneumothorax. Persistent mass with volume loss left upper lobe. No new opacity. No change in cardiac silhouette.   Electronically Signed   By: Lowella Grip III M.D.   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 -Appreciate Dr. Vertell Williams with neurosurgery assistance -Anticipate surgical intervention on 6/14 -Transition from oral to IV Decadron 4 mg every 6 hours -Supportive care -Check preoperative EKG  Active Problems:   Dehydration with hyponatremia -Volume depletion from  recent poor intake 1 week with associated hyponatremia -Underlying lung carcinoma  all showed contributing to hyponatremia -IV normal saline at 100 mL per hour    Nausea without vomiting -Treat underlying causes I increased cranial pressure from brain mass (Decadron, surgery) -Symptom management with antiemetics    Severe protein-calorie malnutrition -We'll need formal nutrition evaluation postoperatively    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 Inda Merlin added as consultant in [place] with Dr. Melven Sartorius request for palliative evaluation    CAD (coronary artery disease) -Currently asymptomatic and due to hypotension carvedilol recently discontinued     DVT Prophylaxis: SCDs  Family Communication: No family at bedside    Code Status:  Full code  Condition:  Guarded  Discharge disposition: Is dependent upon how patient tolerates craniotomy; will need formal PT/OT evaluation postop-likely will need to discuss with family their ability to care for patient prior to discharge  Time spent in minutes : 60      ELLIS,ALLISON L. ANP on 03/22/2015 at 11:53 AM  Between 7am to 7pm - Pager - [phone]  After 7pm go to [email or web address] - password [email or web address]  And look for the night coverage person covering me after hours  [facility]

## 2015-03-22 NOTE — Telephone Encounter (Signed)
Oncology Nurse Navigator Documentation  Oncology Nurse Navigator Flowsheets 03/22/2015  Navigator Encounter Type Telephone He had a procedure today, SRS and wanted to see how he was doing.  I left a vm message with my name and phone number to call if needed.   Patient Visit Type Radiation therapy  Treatment Phase Treatment/SRS  Time Spent with Patient 15

## 2015-03-22 NOTE — Progress Notes (Signed)
After admission to [facility] Rm 6, I spoke with Gilbert Williams's nurse, Claiborne Billings about the need for a surgical planning MRI to be done. She is going to place an order for a Brain Lab protocol scan to be done for surgical planning sometime today or tomorrow. Currently his surgery is scheduled for 6/15 with Dr. Vertell Limber.   Gilbert Williams

## 2015-03-22 NOTE — Progress Notes (Signed)
Patient to be given '8mg'$  oral dexamethasone x 1 per verbal order Dr. [name]. Gilbert Williams, went to [place] [facility] [place] [facility], will call when to give patient

## 2015-03-22 NOTE — Progress Notes (Signed)
Chaplain initiated visit with pt. Pt did not want to be disturbed at this time. Chaplain informed pt of her services should he need them.   03/22/15 1400  Clinical Encounter Type  Visited With Patient  Visit Type Initial;Spiritual support  Rolly Salter, Chaplain 03/22/2015 2:06 PM

## 2015-03-23 DIAGNOSIS — C7931 Secondary malignant neoplasm of brain: Principal | ICD-10-CM

## 2015-03-23 DIAGNOSIS — E43 Unspecified severe protein-calorie malnutrition: Secondary | ICD-10-CM

## 2015-03-23 DIAGNOSIS — E871 Hypo-osmolality and hyponatremia: Secondary | ICD-10-CM

## 2015-03-23 DIAGNOSIS — R531 Weakness: Secondary | ICD-10-CM

## 2015-03-23 DIAGNOSIS — R110 Nausea: Secondary | ICD-10-CM

## 2015-03-23 DIAGNOSIS — Z66 Do not resuscitate: Secondary | ICD-10-CM

## 2015-03-23 DIAGNOSIS — Z515 Encounter for palliative care: Secondary | ICD-10-CM

## 2015-03-23 MED ORDER — ENSURE ENLIVE PO LIQD: 237.0000 mL | Freq: Three times a day (TID) | ORAL | Status: DC | PRN

## 2015-03-23 MED ORDER — ALPRAZOLAM 0.25 MG PO TABS
0.2500 mg | ORAL_TABLET | Freq: Every day | ORAL | Status: DC
  Administered 2015-03-23 – 2015-03-28 (×6): 0.25 mg via ORAL
  Filled 2015-03-23 (×7): qty 1

## 2015-03-23 MED ORDER — CEFAZOLIN SODIUM-DEXTROSE 2-3 GM-% IV SOLR
2.0000 g | INTRAVENOUS | Status: AC
  Administered 2015-03-24: 2 g via INTRAVENOUS
  Filled 2015-03-23: qty 50

## 2015-03-23 NOTE — Progress Notes (Signed)
Per Dr. Vertell Limber, patient cleared to go outside. Charge RN Olivia Mackie aware.

## 2015-03-23 NOTE — Consult Note (Signed)
Consultation Note Date: 03/23/2015   Patient Name: Gilbert Williams  DOB: [birth year]  MRN: [id number]  Age / Sex: 54 y.o., male   PCP: Daryel Gerald, MD Referring Physician: Delfina Redwood, MD  Reason for Consultation: Establishing goals of care and Psychosocial/spiritual support  Palliative Care Assessment and Plan Summary of Established Goals of Care and Medical Treatment Preferences    Palliative Care Discussion Held Today:   This NP Wadie Lessen reviewed medical records, received report from team, assessed the patient and then meet at the patient's bedside along with his two sister; Drucilla Schmidt, Tomasa Hosteller  to discuss diagnosis, treatment options, GOC,  disposition and anticipatory care needs.  Patient minimally engaged in conversation, "I don't like to talk",  Easily irritated and shuts down. "Why bother with any of this I'm going to die anyway", discussed patient autonomy and choices for all offered medical interventions     Sisters verbalize great concern specifically to treatment compliance and anticipatory are needs, "he can't go home alone".    A detailed discussion was had today regarding advanced directives.  Concepts specific to code status, artifical feeding and hydration, continued IV antibiotics and rehospitalization was had.  The difference between a aggressive medical intervention path  and a palliative comfort care path for this patient at this time was had.  Values and goals of care important to patient and family were attempted to be elicited.  Concept of Hospice and Palliative Care were discussed  Natural trajectory and expectations at EOL were discussed.  Questions and concerns addressed.   Family encouraged to call with questions or concerns.  PMT will continue to support holistically.   Contacts/Participants in Discussion:  Patient and two sisters  Primary Decision Maker: Patient  Goals of Care/Code Status/Advance Care Planning:   Code Status:   DNR/DNI-documented today  Patient has confirmed decision to move forward with the surgery in the morning.  He is unsure at this time what further treatments he is open to.  Artificial feeding: not now or in the future    Psycho-social/Spiritual:   Support System: two sisters and a neighbor.  He presently lives alone, he lived with his mother his entire life and his mother died six months ago  Desire for further Chaplaincy support:no-declined at this time  Prognosis: < 6 months  Discharge Planning:  Pending outcomes       Chief Complaint: weakness and fatigue, nausea and vomitting  History of Present Illness:   15 -year-old male with past medical history of coronary artery disease, status post CABG x 5 in 2009, history of bladder cancer, bone cancer status post right AKA, recently diagnosed with lung cancer, recent hospitalization earlier in June 2016 for hypotension, dizziness. Patient follows outpatient Dr. Julien Nordmann and the initial plan was to begin chemotherapy however MRI of the brain has revealed left cerebellar mass with partial necrosis and surrounding vasogenic edema. Patient was started on Decadron outpatient and the plan was to proceed with stereotactic radiology and possible surgery. Patient reports he was not able to take Decadron because of nausea, poor by mouth intake. Dr. Vertell Limber of neurosurgery has recommended that patient be admitted for hydration with fluids and administration of IV Decadron and surgical intervention is tentatively planned for 03/23/2015.    Primary Diagnoses  Present on Admission:  . Malignant neoplasm of upper lobe of left lung . 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 Severe protein-calorie malnutrition . CAD (coronary artery disease) . Dehydration with hyponatremia .  Nausea without vomiting . Brain mass  Palliative Review of Systems:    - reports weakness, fatigue and weight loss, poor appitite denies pain, nausea    I have  reviewed the medical record, interviewed the patient and family, and examined the patient. The following aspects are pertinent.  Past Medical History  Diagnosis Date  . Coronary artery disease   . Hypertension   . Bladder cancer     x2  . Myocardial infarction 2009  . Lung cancer   . Bone cancer    History   Social History  . Marital Status: Married    Spouse Name: N/A  . Number of Children: 2  . Years of Education: N/A   Occupational History  . Disabled.    Social History Main Topics  . Smoking status: Current Every Day Smoker -- 1.00 packs/day for 36 years    Types: Cigarettes    Start date: 02/23/1979  . Smokeless tobacco: Never Used  . Alcohol Use: No     Comment: Quit drinking 6 months ago.  . Drug Use: Yes     Comment: Marijuana  . Sexual Activity: Not on file   Other Topics Concern  . Not on file   Social History Narrative   Divorced. Lives alone.  Ambulates independently.   Family History  Problem Relation Age of Onset  . Heart failure Mother     Died at 68  . COPD Mother   . Heart attack Father     Died at 66  . Peripheral vascular disease Sister   . Asthma Sister   . Hypertension Sister   . Diabetes Sister    Scheduled Meds: . dexamethasone  4 mg Intravenous 4 times per day  . folic acid  1 mg Intravenous Daily  . thiamine  100 mg Intravenous Daily   Continuous Infusions: . sodium chloride 100 mL/hr at 03/22/15 1045   PRN Meds:.acetaminophen, alum & mag hydroxide-simeth, LORazepam, morphine injection, ondansetron (ZOFRAN) IV, promethazine Medications Prior to Admission:  Prior to Admission medications   Medication Sig Start Date End Date Taking? Authorizing Provider  ALPRAZolam (XANAX) 0.25 MG tablet Take 0.25 mg by mouth at bedtime.   Yes Historical Provider, MD  aspirin EC 81 MG tablet Take 81 mg by mouth daily.   Yes Historical Provider, MD  carvedilol (COREG) 12.5 MG tablet Take 12.5 mg by mouth 2 (two) times daily with a meal.  03/12/15   Yes Historical Provider, MD  losartan (COZAAR) 50 MG tablet Take 50 mg by mouth daily.  03/12/15  Yes Historical Provider, MD  traMADol (ULTRAM) 50 MG tablet Take 1 tablet (50 mg total) by mouth every 6 (six) hours as needed for moderate pain. 03/04/15  Yes Grace Isaac, MD  dexamethasone (DECADRON) 4 MG tablet Take 1 tablet (4 mg total) by mouth 4 (four) times daily. Patient not taking: Reported on 03/18/2015 03/18/15   Curt Bears, MD   Allergies  Allergen Reactions  . Meperidine And Related Other (See Comments)    Demerol////Happened with his amputation surgery,was told he had a bad reaction    CBC:    Component Value Date/Time   WBC 9.5 03/22/2015 1222   WBC 8.8 03/18/2015 1230   HGB 13.0 03/22/2015 1222   HGB 12.1* 03/18/2015 1230   HCT 38.6* 03/22/2015 1222   HCT 36.8* 03/18/2015 1230   PLT 482* 03/22/2015 1222   PLT 495* 03/18/2015 1230   MCV 84.1 03/22/2015 1222   MCV 84.8 03/18/2015  1230   NEUTROABS 8.5* 03/22/2015 1222   NEUTROABS 5.9 03/18/2015 1230   LYMPHSABS 0.7 03/22/2015 1222   LYMPHSABS 1.8 03/18/2015 1230   MONOABS 0.3 03/22/2015 1222   MONOABS 0.9 03/18/2015 1230   EOSABS 0.1 03/22/2015 1222   EOSABS 0.2 03/18/2015 1230   BASOSABS 0.0 03/22/2015 1222   BASOSABS 0.0 03/18/2015 1230   Comprehensive Metabolic Panel:    Component Value Date/Time   NA 131* 03/22/2015 1222   NA 132* 03/18/2015 1230   K 4.0 03/22/2015 1222   K 4.2 03/18/2015 1230   CL 96* 03/22/2015 1222   CO2 23 03/22/2015 1222   CO2 20* 03/18/2015 1230   BUN 8 03/22/2015 1222   BUN 8.9 03/18/2015 1230   CREATININE 0.61 03/22/2015 1222   CREATININE 0.6* 03/18/2015 1230   GLUCOSE 92 03/22/2015 1222   GLUCOSE 86 03/18/2015 1230   CALCIUM 9.0 03/22/2015 1222   CALCIUM 9.0 03/18/2015 1230   AST 22 03/22/2015 1222   AST 22 03/18/2015 1230   ALT 9* 03/22/2015 1222   ALT <6 03/18/2015 1230   ALKPHOS 113 03/22/2015 1222   ALKPHOS 123 03/18/2015 1230   BILITOT 1.0 03/22/2015 1222    BILITOT 0.77 03/18/2015 1230   PROT 6.7 03/22/2015 1222   PROT 6.9 03/18/2015 1230   ALBUMIN 3.2* 03/22/2015 1222   ALBUMIN 3.0* 03/18/2015 1230    Physical Exam:  Vital Signs: BP 97/59 mmHg  Pulse 81  Temp(Src) 98.1 F (36.7 C) (Oral)  Resp 16  Ht '5\' 8"'$  (1.727 m)  Wt 44.589 kg (98 lb 4.8 oz)  BMI 14.95 kg/m2  SpO2 93% SpO2: SpO2: 93 % O2 Device: O2 Device: Not Delivered O2 Flow Rate:   Intake/output summary:  Intake/Output Summary (Last 24 hours) at 03/23/15 1614 Last data filed at 03/23/15 1008  Gross per 24 hour  Intake      0 ml  Output   1650 ml  Net  -1650 ml   LBM: Last BM Date: 03/21/15 Baseline Weight: Weight: 44.589 kg (98 lb 4.8 oz) Most recent weight: Weight: 44.589 kg (98 lb 4.8 oz)  Exam Findings:   General: chronically ill appearing, NAD, pale, cachetic  CVS: RRR Extrem: right AKA Skin: warm and dry Neuro: irritable, flat           Palliative Performance Scale: 30 %                Additional Data Reviewed: Recent Labs     03/22/15  1222  WBC  9.5  HGB  13.0  PLT  482*  NA  131*  BUN  8  CREATININE  0.61     Time In: 1430 Time Out: 1600 Time Total: 90 minutes  Greater than 50%  of this time was spent counseling and coordinating care related to the above assessment and plan.  Discussed with  Dr Conley Canal  Signed by: Wadie Lessen, NP  Knox Royalty, NP  03/23/2015, 4:14 PM  Please contact Palliative Medicine Team phone at [phone] for questions and concerns.   See AMION for contact information

## 2015-03-23 NOTE — Progress Notes (Signed)
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 For surgery tomorrow. D/w PMT. Now DNR Code Status:  DNR Family Communication:  At bedside Disposition Plan:    Consultants:    Procedures:     Antibiotics:    HPI/Subjective: Denies n/v, pain.  Objective: Filed Vitals:   03/23/15 1351  BP: 97/59  Pulse: 81  Temp: 98.1 F (36.7 C)  Resp: 16    Intake/Output Summary (Last 24 hours) at 03/23/15 1615 Last data filed at 03/23/15 1008  Gross per 24 hour  Intake      0 ml  Output   1650 ml  Net  -1650 ml   Filed Weights   03/23/15 0546  Weight: 44.589 kg (98 lb 4.8 oz)    Exam:   General:  Wont speak much. Poor eye contact.  Cardiovascular: RRR  Respiratory: CTA  Abdomen: S, nt, nd  Ext: no CCE  Basic Metabolic Panel:  Recent Labs Lab 03/18/15 1230 2015-04-13 1222  NA 132* 131*  K 4.2 4.0  CL  --  96*  CO2 20* 23  GLUCOSE 86 92  BUN 8.9 8  CREATININE 0.6* 0.61  CALCIUM 9.0 9.0   Liver Function Tests:  Recent Labs Lab 03/18/15 1230 04/13/2015 1222  AST 22 22  ALT <6 9*  ALKPHOS 123 113  BILITOT 0.77 1.0  PROT 6.9 6.7  ALBUMIN 3.0* 3.2*   No results for input(s): LIPASE, AMYLASE in the last 168 hours. No results for input(s): AMMONIA in the last 168 hours. CBC:  Recent Labs Lab 03/18/15 1230 13-Apr-2015 1222  WBC 8.8 9.5  NEUTROABS 5.9 8.5*  HGB 12.1* 13.0  HCT 36.8* 38.6*  MCV 84.8 84.1  PLT 495* 482*   Cardiac Enzymes: No results for input(s): CKTOTAL, CKMB, CKMBINDEX, TROPONINI in the last 168 hours. BNP (last 3 results) No results for  input(s): BNP in the last 8760 hours.  ProBNP (last 3 results) No results for input(s): PROBNP in the last 8760 hours.  CBG:  Recent Labs Lab 03/18/15 0730  GLUCAP 96    No results found for this or any previous visit (from the past 240 hour(s)).   Studies: Mr Kizzie Fantasia Contrast  04/13/15   CLINICAL DATA:  Brain lab protocol. Evaluate metastasis. Symptoms of dizziness and weakness.  EXAM: MRI HEAD WITHOUT AND WITH CONTRAST  TECHNIQUE: Multiplanar, multiecho pulse sequences of the brain and surrounding structures were obtained without and with intravenous contrast.  CONTRAST:  66m MULTIHANCE GADOBENATE DIMEGLUMINE 529 MG/ML IV SOLN  COMPARISON:  MR brain 03/18/2015.  FINDINGS: The study was done on the 1.5 tesla scanner instead of the 3T scanner because we could not confirm the safety of the patient's cardiac stents for the higher field strength unit.  LEFT cerebellar mass is redemonstrated, 4.9 x 4.4 x 3.5 cm with calcification, hemorrhage, and partial necrosis. Surrounding vasogenic edema. Significant mass effect on the dorsolateral fourth ventricle with left-to-right shift. Slight downward displacement of the LEFT tonsil. Mild ventriculomegaly and slight transependymal T2 and FLAIR hyperintensity without definite hydrocephalus. Absence of temporal horn engorgement.  Flow voids are maintained. Unchanged LEFT  lentiform nucleus DVA. No additional lesions are detected. Negative orbits, sinuses, and mastoids. No evidence for dural venous sinus thrombosis or other anomaly.  IMPRESSION: Stable LEFT cerebellar mass favored to represent a metastasis.   Electronically Signed   By: Staci Righter M.D.   On: 03/22/2015 20:41    Scheduled Meds: . dexamethasone  4 mg Intravenous 4 times per day  . folic acid  1 mg Intravenous Daily  . thiamine  100 mg Intravenous Daily   Continuous Infusions: . sodium chloride 100 mL/hr at 03/22/15 1045    Time spent: 15 minutes  [place] [facility]  [facility] Pager [phone]. If 7PM-7AM, please contact night-coverage at [email or web address], password [facility] 03/23/2015, 4:15 PM  LOS: 1 day

## 2015-03-23 NOTE — Progress Notes (Signed)
Initial Nutrition Assessment  DOCUMENTATION CODES:  Underweight  INTERVENTION:  Ensure Enlive (each supplement provides 350kcal and 20 grams of protein) PRN  NUTRITION DIAGNOSIS:  Increased nutrient needs related to chronic illness, other (see comment) (scheduled surgery) as evidenced by estimated needs.   GOAL:  Patient will meet greater than or equal to 90% of their needs   MONITOR:  PO intake, Supplement acceptance, Labs, Weight trends, Skin, I & O's  REASON FOR ASSESSMENT:  Malnutrition Screening Tool    ASSESSMENT: 54 -year-old male with past medical history of coronary artery disease, status post CABG x 5 in 2009, history of bladder cancer, bone cancer status post right AKA, recently diagnosed with lung cancer, recent hospitalization earlier in June 2016 for hypotension, dizziness. The initial plan was to begin chemotherapy however MRI of the brain has revealed left cerebellar mass with partial necrosis and surrounding vasogenic edema. Surgical intervention is planned for 03/24/2015.  Pt seemingly frustrated at time of visit, did not want to talk to RD, answered no to almost every question including "How has your appetite been?". He agrees that "yes" he has lost weight but, would not provide further details. Pt declined all nutrition interventions offered but, is okay with RD ordering Ensure PRN. Pt declined physical exam however, he is noticeably thin. Weight history shows 19 lbs within the past 2 weeks- 16% weight loss.   Labs: low sodium  Height:  Ht Readings from Last 1 Encounters:  03/23/15 '5\' 8"'$  (1.727 m)    Weight:  Wt Readings from Last 1 Encounters:  03/23/15 98 lb 4.8 oz (44.589 kg)    Ideal Body Weight:  58.6 kg  Wt Readings from Last 10 Encounters:  03/23/15 98 lb 4.8 oz (44.589 kg)  03/18/15 100 lb (45.36 kg)  03/18/15 100 lb 8 oz (45.587 kg)  03/12/15 106 lb 8 oz (48.308 kg)  03/10/15 117 lb (53.071 kg)  03/04/15 117 lb (53.071 kg)   02/23/15 117 lb (53.071 kg)    BMI:  Body mass index is 14.95 kg/(m^2).  Estimated Nutritional Needs:  Kcal:  1700-1900  Protein:  70-80 grams  Fluid:  1.7-1.9 L/day  Skin:  Reviewed, no issues  Diet Order:  Diet NPO time specified Diet regular Room service appropriate?: Yes; Fluid consistency:: Thin  EDUCATION NEEDS:  No education needs identified at this time   Intake/Output Summary (Last 24 hours) at 03/23/15 1703 Last data filed at 03/23/15 1008  Gross per 24 hour  Intake      0 ml  Output   1650 ml  Net  -1650 ml    Last BM:  6/12  Pryor Ochoa RD, LDN Inpatient Clinical Dietitian Pager: [phone] After Hours Pager: [phone]

## 2015-03-23 NOTE — Progress Notes (Signed)
Subjective: Patient reports "I'm just tired of dealing with this. I'm ready to be out of here"  Objective: Vital signs in last 24 hours: Temp:  [97.6 F (36.4 C)-98.2 F (36.8 C)] 98 F (36.7 C) (06/14 0546) Pulse Rate:  [78-95] 78 (06/14 0546) Resp:  [15-18] 16 (06/14 0546) BP: (94-110)/(59-71) 100/64 mmHg (06/14 0546) SpO2:  [95 %-100 %] 95 % (06/14 0546) Weight:  [44.589 kg (98 lb 4.8 oz)] 44.589 kg (98 lb 4.8 oz) (06/14 0546)  Intake/Output from previous day: 06/13 0701 - 06/14 0700 In: -  Out: 1425 [Urine:1425] Intake/Output this shift: Total I/O In: -  Out: 150 [Urine:150]  Pt answers questions but offers little conversation, exhibiting anger/frustration with diet, hospital, surgery date, and MRI's. Pt does not believe radiosurgery was completed yesterday and fears having surgery tomorrow instead of today is going to prevent radiosurgery at a later date. He declines liquid breakfast & states he has no appetite for solid foods, either. Acknowledges plan for craniotiomy for tumor removal and agrees to proceed.   Lab Results:  Recent Labs  03/22/15 1222  WBC 9.5  HGB 13.0  HCT 38.6*  PLT 482*   BMET  Recent Labs  03/22/15 1222  NA 131*  K 4.0  CL 96*  CO2 23  GLUCOSE 92  BUN 8  CREATININE 0.61  CALCIUM 9.0    Studies/Results: Mr Gilbert Williams Contrast  03/22/2015   CLINICAL DATA:  Brain lab protocol. Evaluate metastasis. Symptoms of dizziness and weakness.  EXAM: MRI HEAD WITHOUT AND WITH CONTRAST  TECHNIQUE: Multiplanar, multiecho pulse sequences of the brain and surrounding structures were obtained without and with intravenous contrast.  CONTRAST:  65m MULTIHANCE GADOBENATE DIMEGLUMINE 529 MG/ML IV SOLN  COMPARISON:  MR brain 03/18/2015.  FINDINGS: The study was done on the 1.5 tesla scanner instead of the 3T scanner because we could not confirm the safety of the patient's cardiac stents for the higher field strength unit.  LEFT cerebellar mass is  redemonstrated, 4.9 x 4.4 x 3.5 cm with calcification, hemorrhage, and partial necrosis. Surrounding vasogenic edema. Significant mass effect on the dorsolateral fourth ventricle with left-to-right shift. Slight downward displacement of the LEFT tonsil. Mild ventriculomegaly and slight transependymal T2 and FLAIR hyperintensity without definite hydrocephalus. Absence of temporal horn engorgement.  Flow voids are maintained. Unchanged LEFT lentiform nucleus DVA. No additional lesions are detected. Negative orbits, sinuses, and mastoids. No evidence for dural venous sinus thrombosis or other anomaly.  IMPRESSION: Stable LEFT cerebellar mass favored to represent a metastasis.   Electronically Signed   By: JStaci RighterM.D.   On: 03/22/2015 20:41    Assessment/Plan:    LOS: 1 day  Per DrStern, ok to advance diet today as tolerated; NPO after midnight for surgery tomorrow (left suboccipital craniotomy for tumor excision). Reassured pt as to SCS done yesterday and surgery tomorrow was deemed better for pt that late tonight. Permit for surgery.    PVerdis Prime6/14/2016, 8:12 AM

## 2015-03-24 ENCOUNTER — Inpatient Hospital Stay (HOSPITAL_COMMUNITY): Payer: Medicare HMO | Admitting: Certified Registered Nurse Anesthetist

## 2015-03-24 ENCOUNTER — Encounter (HOSPITAL_COMMUNITY): Payer: Self-pay | Admitting: Certified Registered Nurse Anesthetist

## 2015-03-24 ENCOUNTER — Encounter (HOSPITAL_COMMUNITY)
Admission: RE | Disposition: A | Discharge status: Skilled Nursing Facility | Payer: Self-pay | Source: Ambulatory Visit | Attending: Neurosurgery

## 2015-03-24 ENCOUNTER — Inpatient Hospital Stay (HOSPITAL_COMMUNITY): Payer: Medicare HMO

## 2015-03-24 DIAGNOSIS — G939 Disorder of brain, unspecified: Secondary | ICD-10-CM | POA: Diagnosis present

## 2015-03-24 HISTORY — PX: CRANIOTOMY: SHX93

## 2015-03-24 HISTORY — PX: APPLICATION OF CRANIAL NAVIGATION: SHX6578

## 2015-03-24 LAB — SURGICAL PCR SCREEN
MRSA, PCR: NEGATIVE
Staphylococcus aureus: NEGATIVE

## 2015-03-24 SURGERY — CRANIOTOMY TUMOR EXCISION
Anesthesia: General

## 2015-03-24 MED ORDER — ROCURONIUM BROMIDE 50 MG/5ML IV SOLN
INTRAVENOUS | Status: AC
  Filled 2015-03-24: qty 1

## 2015-03-24 MED ORDER — HYDROCODONE-ACETAMINOPHEN 5-325 MG PO TABS
1.0000 | ORAL_TABLET | ORAL | Status: DC | PRN
  Administered 2015-03-24 – 2015-03-29 (×15): 1 via ORAL
  Filled 2015-03-24 (×17): qty 1

## 2015-03-24 MED ORDER — PHENYLEPHRINE HCL 10 MG/ML IJ SOLN
INTRAMUSCULAR | Status: DC | PRN
  Administered 2015-03-24: 40 ug via INTRAVENOUS
  Administered 2015-03-24: 80 ug via INTRAVENOUS

## 2015-03-24 MED ORDER — SODIUM CHLORIDE 0.9 % IV SOLN
500.0000 mg | Freq: Two times a day (BID) | INTRAVENOUS | Status: DC
  Administered 2015-03-24 – 2015-03-29 (×10): 500 mg via INTRAVENOUS
  Filled 2015-03-24 (×13): qty 5

## 2015-03-24 MED ORDER — HEMOSTATIC AGENTS (NO CHARGE) OPTIME
TOPICAL | Status: DC | PRN
  Administered 2015-03-24: 1 via TOPICAL

## 2015-03-24 MED ORDER — DEXAMETHASONE SODIUM PHOSPHATE 4 MG/ML IJ SOLN
INTRAMUSCULAR | Status: DC | PRN
  Administered 2015-03-24: 10 mg via INTRAVENOUS

## 2015-03-24 MED ORDER — DEXTROSE 5 % IV SOLN
10.0000 mg | INTRAVENOUS | Status: DC | PRN
  Administered 2015-03-24: 20 ug/min via INTRAVENOUS

## 2015-03-24 MED ORDER — PROPOFOL 10 MG/ML IV BOLUS
INTRAVENOUS | Status: AC
  Filled 2015-03-24: qty 20

## 2015-03-24 MED ORDER — ONDANSETRON HCL 4 MG PO TABS: 4.0000 mg | ORAL_TABLET | ORAL | Status: DC | PRN

## 2015-03-24 MED ORDER — BUPIVACAINE HCL (PF) 0.5 % IJ SOLN
INTRAMUSCULAR | Status: DC | PRN
  Administered 2015-03-24: 10 mL

## 2015-03-24 MED ORDER — OXYCODONE HCL 5 MG/5ML PO SOLN: 5.0000 mg | Freq: Once | ORAL | Status: DC | PRN

## 2015-03-24 MED ORDER — DOCUSATE SODIUM 100 MG PO CAPS
100.0000 mg | ORAL_CAPSULE | Freq: Two times a day (BID) | ORAL | Status: DC
  Administered 2015-03-26 – 2015-03-29 (×7): 100 mg via ORAL
  Filled 2015-03-24 (×11): qty 1

## 2015-03-24 MED ORDER — PHENYLEPHRINE 40 MCG/ML (10ML) SYRINGE FOR IV PUSH (FOR BLOOD PRESSURE SUPPORT)
PREFILLED_SYRINGE | INTRAVENOUS | Status: AC
  Filled 2015-03-24: qty 20

## 2015-03-24 MED ORDER — ONDANSETRON HCL 4 MG/2ML IJ SOLN: 4.0000 mg | INTRAMUSCULAR | Status: DC | PRN

## 2015-03-24 MED ORDER — ONDANSETRON HCL 4 MG/2ML IJ SOLN: 4.0000 mg | Freq: Once | INTRAMUSCULAR | Status: DC | PRN

## 2015-03-24 MED ORDER — ACETAMINOPHEN 650 MG RE SUPP: 650.0000 mg | RECTAL | Status: DC | PRN

## 2015-03-24 MED ORDER — FENTANYL CITRATE (PF) 250 MCG/5ML IJ SOLN
INTRAMUSCULAR | Status: AC
  Filled 2015-03-24: qty 5

## 2015-03-24 MED ORDER — MIDAZOLAM HCL 5 MG/5ML IJ SOLN
INTRAMUSCULAR | Status: DC | PRN
  Administered 2015-03-24: 2 mg via INTRAVENOUS

## 2015-03-24 MED ORDER — DEXAMETHASONE SODIUM PHOSPHATE 10 MG/ML IJ SOLN
INTRAMUSCULAR | Status: AC
  Filled 2015-03-24: qty 1

## 2015-03-24 MED ORDER — PROPOFOL 10 MG/ML IV BOLUS
INTRAVENOUS | Status: DC | PRN
  Administered 2015-03-24: 100 mg via INTRAVENOUS

## 2015-03-24 MED ORDER — PANTOPRAZOLE SODIUM 40 MG IV SOLR
40.0000 mg | Freq: Every day | INTRAVENOUS | Status: DC
  Administered 2015-03-25 – 2015-03-27 (×3): 40 mg via INTRAVENOUS
  Filled 2015-03-24 (×5): qty 40

## 2015-03-24 MED ORDER — NEOSTIGMINE METHYLSULFATE 10 MG/10ML IV SOLN
INTRAVENOUS | Status: AC
  Filled 2015-03-24: qty 1

## 2015-03-24 MED ORDER — POTASSIUM CHLORIDE IN NACL 20-0.9 MEQ/L-% IV SOLN
INTRAVENOUS | Status: DC
  Administered 2015-03-24 – 2015-03-28 (×9): via INTRAVENOUS
  Filled 2015-03-24 (×14): qty 1000

## 2015-03-24 MED ORDER — GLYCOPYRROLATE 0.2 MG/ML IJ SOLN
INTRAMUSCULAR | Status: AC
  Filled 2015-03-24: qty 3

## 2015-03-24 MED ORDER — OXYCODONE HCL 5 MG PO TABS: 5.0000 mg | ORAL_TABLET | Freq: Once | ORAL | Status: DC | PRN

## 2015-03-24 MED ORDER — FENTANYL CITRATE (PF) 100 MCG/2ML IJ SOLN
INTRAMUSCULAR | Status: DC | PRN
  Administered 2015-03-24 (×3): 50 ug via INTRAVENOUS
  Administered 2015-03-24: 100 ug via INTRAVENOUS
  Administered 2015-03-24: 25 ug via INTRAVENOUS

## 2015-03-24 MED ORDER — NEOSTIGMINE METHYLSULFATE 10 MG/10ML IV SOLN
INTRAVENOUS | Status: DC | PRN
  Administered 2015-03-24: 4 mg via INTRAVENOUS

## 2015-03-24 MED ORDER — MIDAZOLAM HCL 2 MG/2ML IJ SOLN
INTRAMUSCULAR | Status: AC
  Filled 2015-03-24: qty 2

## 2015-03-24 MED ORDER — DEXAMETHASONE SODIUM PHOSPHATE 10 MG/ML IJ SOLN
6.0000 mg | Freq: Four times a day (QID) | INTRAMUSCULAR | Status: AC
  Administered 2015-03-24 – 2015-03-25 (×4): 6 mg via INTRAVENOUS
  Filled 2015-03-24 (×3): qty 1
  Filled 2015-03-24: qty 0.6

## 2015-03-24 MED ORDER — BACITRACIN ZINC 500 UNIT/GM EX OINT
TOPICAL_OINTMENT | CUTANEOUS | Status: DC | PRN
  Administered 2015-03-24 (×2): 1 via TOPICAL

## 2015-03-24 MED ORDER — ONDANSETRON HCL 4 MG/2ML IJ SOLN
INTRAMUSCULAR | Status: AC
  Filled 2015-03-24: qty 2

## 2015-03-24 MED ORDER — CEFAZOLIN SODIUM 1-5 GM-% IV SOLN
1.0000 g | Freq: Three times a day (TID) | INTRAVENOUS | Status: AC
  Administered 2015-03-24 – 2015-03-25 (×2): 1 g via INTRAVENOUS
  Filled 2015-03-24 (×2): qty 50

## 2015-03-24 MED ORDER — THROMBIN 20000 UNITS EX SOLR
CUTANEOUS | Status: DC | PRN
  Administered 2015-03-24: 13:00:00 via TOPICAL

## 2015-03-24 MED ORDER — CEFAZOLIN SODIUM-DEXTROSE 2-3 GM-% IV SOLR
INTRAVENOUS | Status: AC
  Filled 2015-03-24: qty 100

## 2015-03-24 MED ORDER — DEXAMETHASONE SODIUM PHOSPHATE 4 MG/ML IJ SOLN
4.0000 mg | Freq: Three times a day (TID) | INTRAMUSCULAR | Status: DC
  Filled 2015-03-24 (×2): qty 1

## 2015-03-24 MED ORDER — LIDOCAINE HCL (CARDIAC) 20 MG/ML IV SOLN
INTRAVENOUS | Status: DC | PRN
  Administered 2015-03-24: 40 mg via INTRAVENOUS

## 2015-03-24 MED ORDER — 0.9 % SODIUM CHLORIDE (POUR BTL) OPTIME
TOPICAL | Status: DC | PRN
  Administered 2015-03-24 (×3): 1000 mL

## 2015-03-24 MED ORDER — LABETALOL HCL 5 MG/ML IV SOLN: 10.0000 mg | INTRAVENOUS | Status: DC | PRN

## 2015-03-24 MED ORDER — FLEET ENEMA 7-19 GM/118ML RE ENEM
1.0000 | ENEMA | Freq: Once | RECTAL | Status: AC | PRN
  Filled 2015-03-24: qty 1

## 2015-03-24 MED ORDER — LIDOCAINE HCL (CARDIAC) 20 MG/ML IV SOLN
INTRAVENOUS | Status: AC
  Filled 2015-03-24: qty 5

## 2015-03-24 MED ORDER — GLYCOPYRROLATE 0.2 MG/ML IJ SOLN
INTRAMUSCULAR | Status: DC | PRN
  Administered 2015-03-24: 0.6 mg via INTRAVENOUS

## 2015-03-24 MED ORDER — MORPHINE SULFATE 2 MG/ML IJ SOLN
1.0000 mg | INTRAMUSCULAR | Status: DC | PRN
  Administered 2015-03-24 – 2015-03-25 (×3): 1 mg via INTRAVENOUS
  Administered 2015-03-26 – 2015-03-27 (×3): 2 mg via INTRAVENOUS
  Filled 2015-03-24 (×5): qty 1

## 2015-03-24 MED ORDER — POLYETHYLENE GLYCOL 3350 17 G PO PACK
17.0000 g | PACK | Freq: Every day | ORAL | Status: DC | PRN
  Filled 2015-03-24: qty 1

## 2015-03-24 MED ORDER — BISACODYL 10 MG RE SUPP: 10.0000 mg | Freq: Every day | RECTAL | Status: DC | PRN

## 2015-03-24 MED ORDER — PHENYLEPHRINE 40 MCG/ML (10ML) SYRINGE FOR IV PUSH (FOR BLOOD PRESSURE SUPPORT)
PREFILLED_SYRINGE | INTRAVENOUS | Status: AC
  Filled 2015-03-24: qty 10

## 2015-03-24 MED ORDER — THROMBIN 5000 UNITS EX SOLR
OROMUCOSAL | Status: DC | PRN
  Administered 2015-03-24: 14:00:00 via TOPICAL

## 2015-03-24 MED ORDER — DEXAMETHASONE SODIUM PHOSPHATE 4 MG/ML IJ SOLN
4.0000 mg | Freq: Four times a day (QID) | INTRAMUSCULAR | Status: DC
  Administered 2015-03-25 – 2015-03-26 (×3): 4 mg via INTRAVENOUS
  Filled 2015-03-24 (×6): qty 1

## 2015-03-24 MED ORDER — PROMETHAZINE HCL 25 MG PO TABS: 12.5000 mg | ORAL_TABLET | ORAL | Status: DC | PRN

## 2015-03-24 MED ORDER — ACETAMINOPHEN 325 MG PO TABS: 650.0000 mg | ORAL_TABLET | ORAL | Status: DC | PRN

## 2015-03-24 MED ORDER — HYDROMORPHONE HCL 1 MG/ML IJ SOLN: 0.2500 mg | INTRAMUSCULAR | Status: DC | PRN

## 2015-03-24 MED ORDER — ROCURONIUM BROMIDE 100 MG/10ML IV SOLN
INTRAVENOUS | Status: DC | PRN
  Administered 2015-03-24: 10 mg via INTRAVENOUS
  Administered 2015-03-24: 20 mg via INTRAVENOUS
  Administered 2015-03-24: 30 mg via INTRAVENOUS

## 2015-03-24 MED ORDER — PROPOFOL 10 MG/ML IV BOLUS: INTRAVENOUS | Status: DC | PRN

## 2015-03-24 MED ORDER — CETYLPYRIDINIUM CHLORIDE 0.05 % MT LIQD
7.0000 mL | Freq: Two times a day (BID) | OROMUCOSAL | Status: DC
  Administered 2015-03-25 – 2015-03-29 (×6): 7 mL via OROMUCOSAL

## 2015-03-24 SURGICAL SUPPLY — 90 items
BANDAGE GAUZE 4  KLING STR (GAUZE/BANDAGES/DRESSINGS) IMPLANT
BARD ×4 IMPLANT
BIT DRILL WIRE PASS 1.3MM (BIT) IMPLANT
BLADE CLIPPER SURG (BLADE) ×4 IMPLANT
BRUSH SCRUB EZ 1% IODOPHOR (MISCELLANEOUS) ×4 IMPLANT
BRUSH SCRUB EZ PLAIN DRY (MISCELLANEOUS) ×4 IMPLANT
BUR ACORN 6.0 PRECISION (BURR) ×3 IMPLANT
BUR ACORN 6.0MM PRECISION (BURR) ×1
BUR ADDG 1.1 (BURR) IMPLANT
BUR ADDG 1.1MM (BURR)
BUR RND OSTEON ELITE 6.0 (BURR) ×3 IMPLANT
BUR RND OSTEON ELITE 6.0MM (BURR) ×1
BUR SPIRAL ROUTER 2.3 (BUR) ×3 IMPLANT
BUR SPIRAL ROUTER 2.3MM (BUR) ×1
CANISTER SUCT 3000ML PPV (MISCELLANEOUS) ×4 IMPLANT
CLIP TI MEDIUM 6 (CLIP) IMPLANT
CONT SPEC 4OZ CLIKSEAL STRL BL (MISCELLANEOUS) ×8 IMPLANT
COTTONBALL LRG STERILE PKG (GAUZE/BANDAGES/DRESSINGS) ×8 IMPLANT
COVER MAYO STAND STRL (DRAPES) IMPLANT
DECANTER SPIKE VIAL GLASS SM (MISCELLANEOUS) ×4 IMPLANT
DRAIN SNY WOU 7FLT (WOUND CARE) IMPLANT
DRAPE MICROSCOPE LEICA (MISCELLANEOUS) ×4 IMPLANT
DRAPE NEUROLOGICAL W/INCISE (DRAPES) ×4 IMPLANT
DRAPE STERI IOBAN 125X83 (DRAPES) IMPLANT
DRAPE WARM FLUID 44X44 (DRAPE) ×4 IMPLANT
DRILL WIRE PASS 1.3MM (BIT)
DRSG OPSITE 4X5.5 SM (GAUZE/BANDAGES/DRESSINGS) IMPLANT
DRSG OPSITE POSTOP 4X6 (GAUZE/BANDAGES/DRESSINGS) ×8 IMPLANT
DRSG TELFA 3X8 NADH (GAUZE/BANDAGES/DRESSINGS) ×4 IMPLANT
DURAMATRIX ONLAY 2X2 (Neuro Prosthesis/Implant) ×4 IMPLANT
DURAPREP 6ML APPLICATOR 50/CS (WOUND CARE) ×4 IMPLANT
ELECT REM PT RETURN 9FT ADLT (ELECTROSURGICAL) ×4
ELECTRODE REM PT RTRN 9FT ADLT (ELECTROSURGICAL) ×2 IMPLANT
EVACUATOR 1/8 PVC DRAIN (DRAIN) IMPLANT
EVACUATOR SILICONE 100CC (DRAIN) IMPLANT
FORCEPS BIPOLAR SPETZLER 8 1.0 (NEUROSURGERY SUPPLIES) ×4 IMPLANT
GAUZE SPONGE 4X4 12PLY STRL (GAUZE/BANDAGES/DRESSINGS) ×4 IMPLANT
GAUZE SPONGE 4X4 16PLY XRAY LF (GAUZE/BANDAGES/DRESSINGS) IMPLANT
GLOVE BIO SURGEON STRL SZ8 (GLOVE) ×4 IMPLANT
GLOVE BIOGEL PI IND STRL 8 (GLOVE) ×2 IMPLANT
GLOVE BIOGEL PI IND STRL 8.5 (GLOVE) ×2 IMPLANT
GLOVE BIOGEL PI INDICATOR 8 (GLOVE) ×2
GLOVE BIOGEL PI INDICATOR 8.5 (GLOVE) ×2
GLOVE ECLIPSE 8.0 STRL XLNG CF (GLOVE) ×4 IMPLANT
GLOVE EXAM NITRILE LRG STRL (GLOVE) IMPLANT
GLOVE EXAM NITRILE MD LF STRL (GLOVE) IMPLANT
GLOVE EXAM NITRILE XL STR (GLOVE) IMPLANT
GLOVE EXAM NITRILE XS STR PU (GLOVE) IMPLANT
GOWN STRL REUS W/ TWL LRG LVL3 (GOWN DISPOSABLE) ×4 IMPLANT
GOWN STRL REUS W/ TWL XL LVL3 (GOWN DISPOSABLE) ×4 IMPLANT
GOWN STRL REUS W/TWL 2XL LVL3 (GOWN DISPOSABLE) IMPLANT
GOWN STRL REUS W/TWL LRG LVL3 (GOWN DISPOSABLE) ×4
GOWN STRL REUS W/TWL XL LVL3 (GOWN DISPOSABLE) ×4
HEMOSTAT POWDER SURGIFOAM 1G (HEMOSTASIS) ×4 IMPLANT
HEMOSTAT SURGICEL 2X14 (HEMOSTASIS) ×12 IMPLANT
KIT BASIN OR (CUSTOM PROCEDURE TRAY) ×4 IMPLANT
KIT ROOM TURNOVER OR (KITS) ×4 IMPLANT
MARKER SKIN DUAL TIP RULER LAB (MISCELLANEOUS) ×4 IMPLANT
MARKER SPHERE PSV REFLC 13MM (MARKER) ×8 IMPLANT
NEEDLE HYPO 25X1 1.5 SAFETY (NEEDLE) ×4 IMPLANT
NS IRRIG 1000ML POUR BTL (IV SOLUTION) ×8 IMPLANT
PACK CRANIOTOMY (CUSTOM PROCEDURE TRAY) ×4 IMPLANT
PAD ARMBOARD 7.5X6 YLW CONV (MISCELLANEOUS) ×8 IMPLANT
PAD EYE OVAL STERILE LF (GAUZE/BANDAGES/DRESSINGS) IMPLANT
PATTIES SURGICAL .25X.25 (GAUZE/BANDAGES/DRESSINGS) IMPLANT
PATTIES SURGICAL .5 X.5 (GAUZE/BANDAGES/DRESSINGS) ×4 IMPLANT
PATTIES SURGICAL .5 X3 (DISPOSABLE) IMPLANT
PATTIES SURGICAL 1/4 X 3 (GAUZE/BANDAGES/DRESSINGS) IMPLANT
PATTIES SURGICAL 1X1 (DISPOSABLE) IMPLANT
PIN MAYFIELD SKULL DISP (PIN) ×4 IMPLANT
RUBBERBAND STERILE (MISCELLANEOUS) ×8 IMPLANT
SPECIMEN JAR SMALL (MISCELLANEOUS) IMPLANT
SPONGE NEURO XRAY DETECT 1X3 (DISPOSABLE) IMPLANT
SPONGE SURGIFOAM ABS GEL 100 (HEMOSTASIS) ×4 IMPLANT
STAPLER SKIN PROX WIDE 3.9 (STAPLE) ×4 IMPLANT
SUT ETHILON 3 0 FSL (SUTURE) ×4 IMPLANT
SUT NURALON 4 0 TR CR/8 (SUTURE) ×4 IMPLANT
SUT SILK 2 0 FS (SUTURE) IMPLANT
SUT VIC AB 0 CT1 18XCR BRD8 (SUTURE) ×2 IMPLANT
SUT VIC AB 0 CT1 8-18 (SUTURE) ×2
SUT VIC AB 2-0 CP2 18 (SUTURE) ×8 IMPLANT
SYR CONTROL 10ML LL (SYRINGE) IMPLANT
TIP SONASTAR STD MISONIX 1.9 (TRAY / TRAY PROCEDURE) IMPLANT
TOWEL OR 17X24 6PK STRL BLUE (TOWEL DISPOSABLE) ×4 IMPLANT
TOWEL OR 17X26 10 PK STRL BLUE (TOWEL DISPOSABLE) ×4 IMPLANT
TRAY FOLEY W/METER SILVER 14FR (SET/KITS/TRAYS/PACK) ×4 IMPLANT
TUBE CONNECTING 12'X1/4 (SUCTIONS) ×1
TUBE CONNECTING 12X1/4 (SUCTIONS) ×3 IMPLANT
UNDERPAD 30X30 INCONTINENT (UNDERPADS AND DIAPERS) IMPLANT
WATER STERILE IRR 1000ML POUR (IV SOLUTION) ×4 IMPLANT

## 2015-03-24 NOTE — Op Note (Signed)
03/22/2015 - 03/24/2015  3:06 PM  PATIENT:  Gilbert Williams  54 y.o. 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  PROCEDURE:  Procedure(s) with comments: Left Suboccipital craniotomy for tumor excision (Left) - Left Suboccipital craniotomy for excision of tumor APPLICATION OF CRANIAL NAVIGATION (N/A)  SURGEON:  Surgeon(s) and Role:    * Erline Levine, MD - Primary    * Eustace Moore, MD - Assisting  PHYSICIAN ASSISTANT:   ASSISTANTS: Poteat, RN   ANESTHESIA:   general  EBL:  Total I/O In: -  Out: 5400 [QQPYP:9509; Blood:150]  BLOOD ADMINISTERED:none  DRAINS: none   LOCAL MEDICATIONS USED:  LIDOCAINE   SPECIMEN:  Excision  DISPOSITION OF SPECIMEN:  PATHOLOGY  COUNTS:  YES  TOURNIQUET:  * No tourniquets in log *  DICTATION: Patient is 54 year old man with a large left cerebellar metastatic brain tumor.  This is from a lung cancer primary.  Patient has developed nausea and vomiting and early hydrocephalus.  It was elected to take patient to surgery for sub-occipital craniectomy for metastasis.  This was a planned staged tumor resection following stereotactic radiosurgery to the primary tumor.  BrainLab navigation was utilized.  Procedure:  Following smooth intubation, patient was placed in prone position on chest rolls with 3 pin head fixation. Tumor and head was registered. Occipital region was shaved and prepped and draped in usual sterile fashion with betadine scrub and Duraprep.  Area of planned incision was infiltrated with lidocaine. A linear incision was made in the suboccipital region to expose calvarium. High speed drill was used to perform bilateral suboccipital craniectomy to expose the dura.  Dura was opened over the left cerebellar hemisphere.  A corticotomy was created in the left cerebellar hemisphere and carried to the tumor.   This was dense and whitish in color and we ere able to dissect this from surrounding cerebellum.  A cystic cavity was drained and large pieces of tumor were carefully dissected from the cerebellum and removed.   The entire tumor was removed.   Hemostasis was assured with irrigation and cotton balls.  Hemostasis was assured also with Surgifoam and surgicell.  The brain was considerably more relaxed after tumor resection.  The evacuation cavity was lined with Surgifoam. The dura was patched with Dura-matrix and  the fascia was closed with 0 vicryl sutures, subcutaneous tissues were reapproximated with 2-0 vicryl sutures and the skin was re approximated with  staples.  A sterile occlusive dressing was placed.  Patient was returned to a supine position and taken out of pins and extubated in the operating room and taken to Recovery having tolerated his surgery well.  Counts were correct at the end of the case.   PLAN OF CARE: Admit to inpatient   PATIENT DISPOSITION:  PACU - hemodynamically stable.   Delay start of Pharmacological VTE agent (>24hrs) due to surgical blood loss or risk of bleeding: yes

## 2015-03-24 NOTE — Transfer of Care (Signed)
Immediate Anesthesia Transfer of Care Note  Patient: MOHAMMEDALI BEDOY  Procedure(s) Performed: Procedure(s) with comments: Left Suboccipital craniotomy for tumor excision (Left) - Left Suboccipital craniotomy for excision of tumor APPLICATION OF CRANIAL NAVIGATION (N/A)  Patient Location: PACU  Anesthesia Type:General  Level of Consciousness: awake and alert   Airway & Oxygen Therapy: Patient Spontanous Breathing and Patient connected to face mask oxygen  Post-op Assessment: Report given to RN and Post -op Vital signs reviewed and stable  Post vital signs: Reviewed and stable  Last Vitals:  Filed Vitals:   03/24/15 1002  BP: 108/64  Pulse: 73  Temp: 36.6 C  Resp: 16    Complications: No apparent anesthesia complications

## 2015-03-24 NOTE — Progress Notes (Signed)
Awake, alert, conversant.  Conversant.  MAEW.  Doing well.

## 2015-03-24 NOTE — Anesthesia Procedure Notes (Signed)
Procedure Name: Intubation Date/Time: 03/24/2015 11:57 AM Performed by: Ollen Bowl Pre-anesthesia Checklist: Patient identified, Emergency Drugs available, Suction available, Patient being monitored and Timeout performed Patient Re-evaluated:Patient Re-evaluated prior to inductionOxygen Delivery Method: Circle system utilized and Simple face mask Preoxygenation: Pre-oxygenation with 100% oxygen Intubation Type: IV induction Ventilation: Mask ventilation without difficulty and Oral airway inserted - appropriate to patient size Laryngoscope Size: Mac and 4 Grade View: Grade I Tube type: Subglottic suction tube Tube size: 8.0 mm Number of attempts: 1 Airway Equipment and Method: Patient positioned with wedge pillow and Stylet Placement Confirmation: ETT inserted through vocal cords under direct vision,  positive ETCO2 and breath sounds checked- equal and bilateral Secured at: 21 cm Tube secured with: Tape Dental Injury: Teeth and Oropharynx as per pre-operative assessment  Comments: By Domenic Schwab, CRNA

## 2015-03-24 NOTE — Brief Op Note (Signed)
03/22/2015 - 03/24/2015  3:06 PM  PATIENT:  Gilbert Williams  54 y.o. 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  PROCEDURE:  Procedure(s) with comments: Left Suboccipital craniotomy for tumor excision (Left) - Left Suboccipital craniotomy for excision of tumor APPLICATION OF CRANIAL NAVIGATION (N/A)  SURGEON:  Surgeon(s) and Role:    * Erline Levine, MD - Primary    * Eustace Moore, MD - Assisting  PHYSICIAN ASSISTANT:   ASSISTANTS: Poteat, RN   ANESTHESIA:   general  EBL:  Total I/O In: -  Out: 9604 [VWUJW:1191; Blood:150]  BLOOD ADMINISTERED:none  DRAINS: none   LOCAL MEDICATIONS USED:  LIDOCAINE   SPECIMEN:  Excision  DISPOSITION OF SPECIMEN:  PATHOLOGY  COUNTS:  YES  TOURNIQUET:  * No tourniquets in log *  DICTATION: Patient is 54 year old man with a large left cerebellar metastatic brain tumor.  This is from a lung cancer primary.  Patient has developed nausea and vomiting and early hydrocephalus.  It was elected to take patient to surgery for sub-occipital craniectomy for metastasis.  This was a planned staged tumor resection following stereotactic radiosurgery to the primary tumor.  BrainLab navigation was utilized.  Procedure:  Following smooth intubation, patient was placed in prone position on chest rolls with 3 pin head fixation. Tumor and head was registered. Occipital region was shaved and prepped and draped in usual sterile fashion with betadine scrub and Duraprep.  Area of planned incision was infiltrated with lidocaine. A linear incision was made in the suboccipital region to expose calvarium. High speed drill was used to perform bilateral suboccipital craniectomy to expose the dura.  Dura was opened over the left cerebellar hemisphere.  A corticotomy was created in the left cerebellar hemisphere and carried to the tumor.   This was dense and whitish in color and we ere able to dissect this from surrounding cerebellum.  A cystic cavity was drained and large pieces of tumor were carefully dissected from the cerebellum and removed.   The entire tumor was removed.   Hemostasis was assured with irrigation and cotton balls.  Hemostasis was assured also with Surgifoam and surgicell.  The brain was considerably more relaxed after tumor resection.  The evacuation cavity was lined with Surgifoam. The dura was patched with Dura-matrix and  the fascia was closed with 0 vicryl sutures, subcutaneous tissues were reapproximated with 2-0 vicryl sutures and the skin was re approximated with  staples.  A sterile occlusive dressing was placed.  Patient was returned to a supine position and taken out of pins and extubated in the operating room and taken to Recovery having tolerated his surgery well.  Counts were correct at the end of the case.   PLAN OF CARE: Admit to inpatient   PATIENT DISPOSITION:  PACU - hemodynamically stable.   Delay start of Pharmacological VTE agent (>24hrs) due to surgical blood loss or risk of bleeding: yes

## 2015-03-24 NOTE — Progress Notes (Signed)
Subjective: Patient reports ready for surgery  Objective: Vital signs in last 24 hours: Temp:  [97.8 F (36.6 C)-98.3 F (36.8 C)] 98 F (36.7 C) (06/15 0514) Pulse Rate:  [78-92] 92 (06/15 0514) Resp:  [14-20] 14 (06/15 0514) BP: (96-106)/(56-69) 106/69 mmHg (06/15 0514) SpO2:  [92 %-96 %] 92 % (06/15 0514)  Intake/Output from previous day: 06/14 0701 - 06/15 0700 In: -  Out: 1525 [Urine:1525] Intake/Output this shift:    Physical Exam: Stable.  No headache.  Lab Results:  Recent Labs  03/22/15 1222  WBC 9.5  HGB 13.0  HCT 38.6*  PLT 482*   BMET  Recent Labs  03/22/15 1222  NA 131*  K 4.0  CL 96*  CO2 23  GLUCOSE 92  BUN 8  CREATININE 0.61  CALCIUM 9.0    Studies/Results: Mr Kizzie Fantasia Contrast  03/22/2015   CLINICAL DATA:  Brain lab protocol. Evaluate metastasis. Symptoms of dizziness and weakness.  EXAM: MRI HEAD WITHOUT AND WITH CONTRAST  TECHNIQUE: Multiplanar, multiecho pulse sequences of the brain and surrounding structures were obtained without and with intravenous contrast.  CONTRAST:  30m MULTIHANCE GADOBENATE DIMEGLUMINE 529 MG/ML IV SOLN  COMPARISON:  MR brain 03/18/2015.  FINDINGS: The study was done on the 1.5 tesla scanner instead of the 3T scanner because we could not confirm the safety of the patient's cardiac stents for the higher field strength unit.  LEFT cerebellar mass is redemonstrated, 4.9 x 4.4 x 3.5 cm with calcification, hemorrhage, and partial necrosis. Surrounding vasogenic edema. Significant mass effect on the dorsolateral fourth ventricle with left-to-right shift. Slight downward displacement of the LEFT tonsil. Mild ventriculomegaly and slight transependymal T2 and FLAIR hyperintensity without definite hydrocephalus. Absence of temporal horn engorgement.  Flow voids are maintained. Unchanged LEFT lentiform nucleus DVA. No additional lesions are detected. Negative orbits, sinuses, and mastoids. No evidence for dural venous sinus  thrombosis or other anomaly.  IMPRESSION: Stable LEFT cerebellar mass favored to represent a metastasis.   Electronically Signed   By: JStaci RighterM.D.   On: 03/22/2015 20:41    Assessment/Plan: Plan craniotomy today.    LOS: 2 days    SPeggyann Shoals MD 03/24/2015, 8:52 AM

## 2015-03-24 NOTE — Progress Notes (Signed)
Pt in OR. Chart reviewed. D/w RN, no new issues overnight.  Doree Barthel, MD [facility]

## 2015-03-24 NOTE — Anesthesia Postprocedure Evaluation (Signed)
  Anesthesia Post-op Note  Patient: Gilbert Williams  Procedure(s) Performed: Procedure(s) with comments: Left Suboccipital craniotomy for tumor excision (Left) - Left Suboccipital craniotomy for excision of tumor APPLICATION OF CRANIAL NAVIGATION (N/A)  Patient Location: PACU  Anesthesia Type:General  Level of Consciousness: awake, alert  and oriented  Airway and Oxygen Therapy: Patient Spontanous Breathing and Patient connected to nasal cannula oxygen  Post-op Pain: mild  Post-op Assessment: Post-op Vital signs reviewed, Patient's Cardiovascular Status Stable, Respiratory Function Stable, Patent Airway and Pain level controlled LLE Motor Response: Purposeful movement LLE Sensation: Full sensation, No numbness, No pain, No tingling RLE Motor Response: Purposeful movement RLE Sensation: Full sensation, No numbness, No pain, No tingling      Post-op Vital Signs: stable  Last Vitals:  Filed Vitals:   03/24/15 1530  BP:   Pulse: 87  Temp:   Resp: 29    Complications: No apparent anesthesia complications

## 2015-03-24 NOTE — Anesthesia Preprocedure Evaluation (Signed)
Anesthesia Evaluation  Patient identified by MRN, date of birth, ID band Patient awake    Reviewed: Allergy & Precautions, NPO status , Patient's Chart, lab work & pertinent test results  Airway Mallampati: II  TM Distance: >3 FB Neck ROM: Full    Dental  (+) Edentulous Upper, Edentulous Lower   Pulmonary Current Smoker,    + decreased breath sounds      Cardiovascular hypertension, Rhythm:Regular Rate:Normal     Neuro/Psych    GI/Hepatic   Endo/Other    Renal/GU      Musculoskeletal   Abdominal (+) + scaphoid   Peds  Hematology   Anesthesia Other Findings   Reproductive/Obstetrics                             Anesthesia Physical Anesthesia Plan  ASA: III  Anesthesia Plan: General   Post-op Pain Management:    Induction: Intravenous  Airway Management Planned: Oral ETT  Additional Equipment: Arterial line, Ultrasound Guidance Line Placement and CVP  Intra-op Plan:   Post-operative Plan: Extubation in OR  Informed Consent: I have reviewed the patients History and Physical, chart, labs and discussed the procedure including the risks, benefits and alternatives for the proposed anesthesia with the patient or authorized representative who has indicated his/her understanding and acceptance.     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 AKA 1981 Smoker  )        Anesthesia Quick Evaluation

## 2015-03-25 ENCOUNTER — Other Ambulatory Visit (HOSPITAL_COMMUNITY)
Admission: RE | Admit: 2015-03-25 | Discharge: 2015-03-25 | Disposition: A | Discharge status: Home / Self Care | Payer: Medicare HMO | Source: Ambulatory Visit | Attending: Internal Medicine | Admitting: Internal Medicine

## 2015-03-25 DIAGNOSIS — C3412 Malignant neoplasm of upper lobe, left bronchus or lung: Secondary | ICD-10-CM | POA: Insufficient documentation

## 2015-03-25 NOTE — Progress Notes (Signed)
Subjective: Patient reports "I'm doing ok...sleeping mostly"  Objective: Vital signs in last 24 hours: Temp:  [96.5 F (35.8 C)-97.7 F (36.5 C)] 97.7 F (36.5 C) (06/16 0744) Pulse Rate:  [58-87] 65 (06/16 0700) Resp:  [9-29] 27 (06/16 0700) BP: (89-126)/(54-97) 95/61 mmHg (06/16 0700) SpO2:  [89 %-100 %] 91 % (06/16 0700) Arterial Line BP: (99-155)/(55-77) 110/57 mmHg (06/16 0700)  Intake/Output from previous day: 06/15 0701 - 06/16 0700 In: 1562.5 [P.O.:240; I.V.:1012.5; IV Piggyback:310] Out: 7078 [Urine:3225; Blood:150] Intake/Output this shift:    Alert, conversant, smiling today. Reports only incisional tenderness and posterior cervical soreness. Drsg intact, minimal bloody drainage at site. No swelling. MAEW. PEARL. Appetite remains poor.   Lab Results:  Recent Labs  03/22/15 1222  WBC 9.5  HGB 13.0  HCT 38.6*  PLT 482*   BMET  Recent Labs  03/22/15 1222  NA 131*  K 4.0  CL 96*  CO2 23  GLUCOSE 92  BUN 8  CREATININE 0.61  CALCIUM 9.0    Studies/Results: Dg Chest Port 1 View  03/24/2015   CLINICAL DATA:  54 year old male status post central line placement  EXAM: PORTABLE CHEST - 1 VIEW  COMPARISON:  Prior chest x-ray 03/12/2015; prior PET-CT 03/18/2015  FINDINGS: New right IJ approach central venous catheter. Catheter tip overlies the mid SVC. No evidence of a pneumothorax or pleural effusion. Stable appearance of the left chest with known extensive left upper lung and mediastinal tumor as well as post obstructed atelectasis of the left upper lobe. Patient is status post median sternotomy with evidence of prior multivessel CABG. No acute osseous abnormality.  IMPRESSION: 1. Right IJ approach central venous catheter. The catheter tip overlies the mid SVC. 2. No evidence of a pneumothorax or new pleural effusion. 3. Stable left upper lobe pulmonary mass, extensive mediastinal and left hilar adenopathy and postobstructive changes in the left upper lobe.    Electronically Signed   By: Jacqulynn Cadet M.D.   On: 03/24/2015 15:47    Assessment/Plan: Doing well post-op day 1   LOS: 3 days  Continue support.    Verdis Prime 03/25/2015, 10:49 AM

## 2015-03-25 NOTE — Progress Notes (Signed)
Pt refusing to go to follow up MRI for shift. Pt expressed concerns and pain management. Reviewed possible interventions that this nurse could perform as well as informed pt of importance and will have to update MD of refusal. Pt responds," Go ahead, I will let them know myself why I'm not going".  Dr. Kathyrn Sheriff paged, notified of pt's history, course of care, concerns and refusal. No new orders, will continue to monitor closely.

## 2015-03-25 NOTE — Progress Notes (Signed)
  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   Indication for treatment:  Palliation       Radiation treatment dates:   03/22/2015  Site/dose/beams/energy:   Left cerebellar 5cm target was treated using 4 Rapid Arc VMAT Beams to a prescription dose of 15 Gy.  ExacTrac Snap verification was performed for each couch angle.  Narrative: The patient tolerated radiation treatment relatively well.    Plan: The patient has completed radiation treatment. He is set to have surgical resection.  The patient will return to radiation oncology clinic for routine followup in one month. I advised him to call or return sooner if he has any questions or concerns related to his recovery or treatment. ________________________________  Sheral Apley. Tammi Klippel, M.D.

## 2015-03-25 NOTE — Progress Notes (Signed)
Pt is now in ICU on Dr. Melven Sartorius service. [facility] will be available as needed.   Doree Barthel, MD [facility]

## 2015-03-26 ENCOUNTER — Inpatient Hospital Stay (HOSPITAL_COMMUNITY): Payer: Medicare HMO

## 2015-03-26 ENCOUNTER — Encounter (HOSPITAL_COMMUNITY): Payer: Self-pay | Admitting: Neurosurgery

## 2015-03-26 MED ORDER — DEXAMETHASONE SODIUM PHOSPHATE 4 MG/ML IJ SOLN: 4.0000 mg | Freq: Three times a day (TID) | INTRAMUSCULAR | Status: DC

## 2015-03-26 MED ORDER — DEXAMETHASONE SODIUM PHOSPHATE 4 MG/ML IJ SOLN
4.0000 mg | Freq: Two times a day (BID) | INTRAMUSCULAR | Status: DC
  Administered 2015-03-28 – 2015-03-29 (×3): 4 mg via INTRAVENOUS
  Filled 2015-03-26 (×3): qty 1

## 2015-03-26 MED ORDER — DEXAMETHASONE SODIUM PHOSPHATE 4 MG/ML IJ SOLN
4.0000 mg | Freq: Three times a day (TID) | INTRAMUSCULAR | Status: AC
  Administered 2015-03-26 – 2015-03-27 (×4): 4 mg via INTRAVENOUS
  Filled 2015-03-26 (×4): qty 1

## 2015-03-26 MED ORDER — ALPRAZOLAM 0.25 MG PO TABS
0.2500 mg | ORAL_TABLET | Freq: Three times a day (TID) | ORAL | Status: DC | PRN
  Administered 2015-03-26 – 2015-03-29 (×3): 0.25 mg via ORAL
  Filled 2015-03-26 (×2): qty 1

## 2015-03-26 NOTE — Evaluation (Signed)
Occupational Therapy Evaluation Patient Details Name: Gilbert Williams MRN: [id number] DOB: 11/28/1960 Today's Date: 03/26/2015    History of Present Illness 54 -year-old male with past medical history of coronary artery disease, status post CABG x 5 in 2009, history of bladder cancer, bone cancer status post right AKA, recently diagnosed with lung cancer, recent hospitalization earlier in June 2016 for hypotension, dizziness. Patient follows outpatient Dr. Julien Nordmann and the initial plan was to begin chemotherapy however MRI of the brain has revealed left cerebellar mass with partial necrosis and surrounding vasogenic edema.  Pt s/p left suboccipital craniotomy for excision of tumor.   Clinical Impression   Pt admitted with above. He demonstrates the below listed deficits and will benefit from continued OT to maximize safety and independence with BADLs. Pt presents to OT with generalized weakness, impaired balance, acute pain, and c/o dizziness.  He requires min A for ADLs.  He doesn't have adequate assist at discharge.  Recommend SNF level rehab       Follow Up Recommendations  SNF    Equipment Recommendations  None recommended by OT    Recommendations for Other Services       Precautions / Restrictions Precautions Precautions: Fall      Mobility Bed Mobility Overal bed mobility: Needs Assistance Bed Mobility: Rolling;Sidelying to Sit;Sit to Supine Rolling: Modified independent (Device/Increase time) Sidelying to sit: Supervision   Sit to supine: Supervision   General bed mobility comments: supervision only for safety  Transfers Overall transfer level: Needs assistance Equipment used: 1 person hand held assist Transfers: Sit to/from Stand Sit to Stand: Min assist         General transfer comment: minimal use of UE's, L LE supported against bed frame.  Pt mildly unsteady due to dizziness    Balance Overall balance assessment: Needs assistance Sitting-balance support:  No upper extremity supported Sitting balance-Leahy Scale: Good Sitting balance - Comments: accepts challenge well, easily donned sock while balanceing     Standing balance-Leahy Scale: Poor Standing balance comment: Did well, but felt the need to support L LE against the bed and accepted support due to dizzy.                            ADL Overall ADL's : Needs assistance/impaired Eating/Feeding: Independent   Grooming: Wash/dry hands;Wash/dry face;Oral care;Brushing hair;Set up;Sitting   Upper Body Bathing: Set up;Sitting   Lower Body Bathing: Minimal assistance;Sit to/from stand   Upper Body Dressing : Set up;Sitting   Lower Body Dressing: Minimal assistance;Sit to/from stand   Toilet Transfer: Minimal assistance;Stand-pivot;BSC   Toileting- Clothing Manipulation and Hygiene: Minimal assistance;Sit to/from stand       Functional mobility during ADLs: Minimal assistance General ADL Comments: Pt limited by pain and dizziness      Vision Vision Assessment?: Yes Eye Alignment: Within Functional Limits Alignment/Gaze Preference: Within Defined Limits Tracking/Visual Pursuits: Able to track stimulus in all quads without difficulty Visual Fields: No apparent deficits Additional Comments: No evidence of nystagmus noted with visual testing    Perception Perception Perception Tested?: Yes   Praxis Praxis Praxis tested?: Within functional limits    Pertinent Vitals/Pain Pain Assessment: Faces Pain Score: 6  Faces Pain Scale: Hurts even more Pain Location: neck/incision  Pain Descriptors / Indicators: Aching;Constant Pain Intervention(s): Monitored during session;Repositioned;Limited activity within patient's tolerance     Hand Dominance     Extremity/Trunk Assessment Upper Extremity Assessment Upper Extremity Assessment: Generalized weakness  Lower Extremity Assessment Lower Extremity Assessment: Defer to PT evaluation RLE Deficits / Details: hx R  AKA   Cervical / Trunk Assessment Cervical / Trunk Assessment: Normal   Communication Communication Communication: No difficulties   Cognition Arousal/Alertness: Awake/alert Behavior During Therapy: WFL for tasks assessed/performed;Restless Overall Cognitive Status: Within Functional Limits for tasks assessed                     General Comments       Exercises       Shoulder Instructions      Home Living Family/patient expects to be discharged to:: Skilled nursing facility Living Arrangements: Alone Available Help at Discharge: Available PRN/intermittently;Family Type of Home: Mobile home Home Access: Stairs to enter Entrance Stairs-Number of Steps: 5 Entrance Stairs-Rails: Right Home Layout: One level               Home Equipment: Crutches          Prior Functioning/Environment Level of Independence: Independent with assistive device(s);Needs assistance  Gait / Transfers Assistance Needed: Pt ambulated with crutches ADL's / Homemaking Assistance Needed: Family was assisting with IADLs         OT Diagnosis: Generalized weakness   OT Problem List: Decreased strength;Decreased activity tolerance;Impaired balance (sitting and/or standing);Pain   OT Treatment/Interventions: Self-care/ADL training;Neuromuscular education;DME and/or AE instruction;Therapeutic activities;Patient/family education;Balance training    OT Goals(Current goals can be found in the care plan section) Acute Rehab OT Goals Patient Stated Goal: to reduce pain and eventually go home  OT Goal Formulation: With patient Time For Goal Achievement: 2015-05-08 Potential to Achieve Goals: Good ADL Goals Pt Will Perform Grooming: with min guard assist;standing Pt Will Perform Lower Body Bathing: with min guard assist;sit to/from stand Pt Will Perform Lower Body Dressing: with min guard assist;sit to/from stand Pt Will Transfer to Toilet: with min guard assist;stand pivot transfer;bedside  commode;regular height toilet;ambulating Pt Will Perform Toileting - Clothing Manipulation and hygiene: with min guard assist;sit to/from stand  OT Frequency: Min 2X/week   Barriers to D/C: Decreased caregiver support          Co-evaluation              End of Session Nurse Communication: Mobility status  Activity Tolerance: No increased pain Patient left: in bed;with call bell/phone within reach;with family/visitor present   Time: 6270-3500 OT Time Calculation (min): 13 min Charges:  OT General Charges $OT Visit: 1 Procedure OT Evaluation $Initial OT Evaluation Tier I: 1 Procedure G-Codes:    Lucille Passy M 04/24/15, 8:03 PM

## 2015-03-26 NOTE — Evaluation (Signed)
Physical Therapy Evaluation Patient Details Name: Gilbert Williams MRN: [id number] DOB: 1961/02/11 Today's Date: 03/26/2015   History of Present Illness  54 -year-old male with past medical history of coronary artery disease, status post CABG x 5 in 2009, history of bladder cancer, bone cancer status post right AKA, recently diagnosed with lung cancer, recent hospitalization earlier in June 2016 for hypotension, dizziness. Patient follows outpatient Dr. Julien Nordmann and the initial plan was to begin chemotherapy however MRI of the brain has revealed left cerebellar mass with partial necrosis and surrounding vasogenic edema.  Pt s/p left suboccipital craniotomy for excision of tumor.  Clinical Impression  Pt admitted with/for resection of cerebellar tumor.  Pt currently limited functionally due to the problems listed. ( See problems list.)   Pt will benefit from PT to maximize function and safety in order to get ready for next venue listed below.     Follow Up Recommendations SNF    Equipment Recommendations   (new set of crutches)    Recommendations for Other Services       Precautions / Restrictions Precautions Precautions: Fall      Mobility  Bed Mobility Overal bed mobility: Needs Assistance Bed Mobility: Rolling;Sidelying to Sit;Sit to Supine Rolling: Modified independent (Device/Increase time) Sidelying to sit: Supervision   Sit to supine: Supervision   General bed mobility comments: supervision only for safety  Transfers Overall transfer level: Needs assistance   Transfers: Sit to/from Stand Sit to Stand: Min assist         General transfer comment: minimal use of UE's, L LE supported against bed frame.  Pt mildly unsteady due to dizziness  Ambulation/Gait                Stairs            Wheelchair Mobility    Modified Rankin (Stroke Patients Only)       Balance Overall balance assessment: Needs assistance Sitting-balance support: No upper  extremity supported Sitting balance-Leahy Scale: Good Sitting balance - Comments: accepts challenge well, easily donned sock while balanceing     Standing balance-Leahy Scale: Poor Standing balance comment: Did well, but felt the need to support L LE against the bed and accepted support due to dizzy.                             Pertinent Vitals/Pain Pain Assessment: Faces Faces Pain Scale: Hurts even more Pain Location: neck incision Pain Descriptors / Indicators: Aching;Operative site guarding;Grimacing Pain Intervention(s): Monitored during session;Repositioned    Home Living Family/patient expects to be discharged to:: Private residence Living Arrangements: Alone Available Help at Discharge: Available PRN/intermittently;Family Type of Home: Mobile home Home Access: Stairs to enter Entrance Stairs-Rails: Right Entrance Stairs-Number of Steps: 5 Home Layout: One level Home Equipment: Crutches      Prior Function Level of Independence: Independent with assistive device(s)               Hand Dominance        Extremity/Trunk Assessment   Upper Extremity Assessment: Defer to OT evaluation           Lower Extremity Assessment: Overall WFL for tasks assessed RLE Deficits / Details: hx R AKA    Cervical / Trunk Assessment: Normal  Communication   Communication: No difficulties  Cognition Arousal/Alertness: Awake/alert Behavior During Therapy: WFL for tasks assessed/performed;Restless Overall Cognitive Status: Within Functional Limits for tasks assessed  General Comments      Exercises        Assessment/Plan    PT Assessment Patient needs continued PT services  PT Diagnosis Difficulty walking   PT Problem List Decreased activity tolerance;Decreased balance;Decreased mobility;Pain;Other (comment) (mild vertigo.)  PT Treatment Interventions Gait training;Functional mobility training;Therapeutic activities;Stair  training;Patient/family education   PT Goals (Current goals can be found in the Care Plan section) Acute Rehab PT Goals Patient Stated Goal: eventually back home. PT Goal Formulation: With patient Time For Goal Achievement: 04/02/15 Potential to Achieve Goals: Good    Frequency Min 3X/week   Barriers to discharge        Co-evaluation               End of Session   Activity Tolerance: Patient tolerated treatment well;Patient limited by pain Patient left: in bed;with call bell/phone within reach;with family/visitor present Nurse Communication: Mobility status         Time: 0051-1021 PT Time Calculation (min) (ACUTE ONLY): 13 min   Charges:   PT Evaluation $Initial PT Evaluation Tier I: 1 Procedure     PT G Codes:        Sandy Haye, Tessie Fass 03/26/2015, 6:29 PM 03/26/2015  Donnella Sham, PT [phone]  (pager)

## 2015-03-26 NOTE — Progress Notes (Signed)
Subjective: Patient reports angry and wants to go home.  "This isn't what I signed up for."  Objective: Vital signs in last 24 hours: Temp:  [97.4 F (36.3 C)-98.1 F (36.7 C)] 97.6 F (36.4 C) (06/17 0817) Pulse Rate:  [62-80] 70 (06/17 0400) Resp:  [10-20] 14 (06/17 0400) BP: (89-114)/(60-81) 109/69 mmHg (06/17 0400) SpO2:  [90 %-93 %] 90 % (06/17 0400) Arterial Line BP: (71-134)/(55-94) 93/63 mmHg (06/16 1727)  Intake/Output from previous day: 06/16 0701 - 06/17 0700 In: 1855 [I.V.:1650; IV Piggyback:205] Out: 1955 [Urine:1955] Intake/Output this shift:    Physical Exam: Benign exam.  Minimal drainage to dressing.  Lab Results: No results for input(s): WBC, HGB, HCT, PLT in the last 72 hours. BMET No results for input(s): NA, K, CL, CO2, GLUCOSE, BUN, CREATININE, CALCIUM in the last 72 hours.  Studies/Results: Dg Chest Port 1 View  03/24/2015   CLINICAL DATA:  54 year old male status post central line placement  EXAM: PORTABLE CHEST - 1 VIEW  COMPARISON:  Prior chest x-ray 03/12/2015; prior PET-CT 03/18/2015  FINDINGS: New right IJ approach central venous catheter. Catheter tip overlies the mid SVC. No evidence of a pneumothorax or pleural effusion. Stable appearance of the left chest with known extensive left upper lung and mediastinal tumor as well as post obstructed atelectasis of the left upper lobe. Patient is status post median sternotomy with evidence of prior multivessel CABG. No acute osseous abnormality.  IMPRESSION: 1. Right IJ approach central venous catheter. The catheter tip overlies the mid SVC. 2. No evidence of a pneumothorax or new pleural effusion. 3. Stable left upper lobe pulmonary mass, extensive mediastinal and left hilar adenopathy and postobstructive changes in the left upper lobe.   Electronically Signed   By: Jacqulynn Cadet M.D.   On: 03/24/2015 15:47    Assessment/Plan: Patient is angry and refuses MRI.  He wants to go home.  His sister, Blakeley Margraf Art  says she can't handle him at home.  He refuses Palliative Care involvement.  He is doing well from his tumor resection.  His social issues are the greatest issue at present.  Patient also is not interested in Hospice.  I have asked SW to assist [place].    LOS: 4 days    Peggyann Shoals, MD 03/26/2015, 9:33 AM

## 2015-03-26 NOTE — Progress Notes (Signed)
Daily Progress Note   Patient Name: Gilbert Williams       Date: 03/26/2015 DOB: 05-29-1961  Age: 54 y.o. MRN#: [id number] Attending Physician: Erline Levine, MD Primary Care Physician: Daryel Gerald, MD Admit Date: 03/22/2015  Reason for Consultation/Follow-up: Disposition, Establishing goals of care and Psychosocial/spiritual support  Subjective:     -patient is irritable but agrees to meet with me.    Continued conversation regarding diagnosis, prognosis, treatment options and care needs.  As on previous discussions [name] resorts back to "I don't care do what ever you want".  Reinforced that  this is his care plan and the importance of his input and involvement.  He tells me his sisters "are out of it".  He has no other family,  and no one else to depend on.  -discussed possibility of short term rehab and working with PT here in the hospital.  He is in agreement for SNF on discharge presently.  He tells me his plan is to f/u with Dr Earlie Server for consideration of offered chemotherapy, "I'm not a quiter"   Length of Stay: 4 days  Current Medications: Scheduled Meds:  . ALPRAZolam  0.25 mg Oral QHS  . antiseptic oral rinse  7 mL Mouth Rinse BID  . dexamethasone  4 mg Intravenous 3 times per day   Followed by  . [START ON 03/28/2015] dexamethasone  4 mg Intravenous Q12H  . docusate sodium  100 mg Oral BID  . folic acid  1 mg Intravenous Daily  . levETIRAcetam  500 mg Intravenous Q12H  . pantoprazole (PROTONIX) IV  40 mg Intravenous QHS  . thiamine  100 mg Intravenous Daily    Continuous Infusions: . sodium chloride Stopped (03/24/15 1630)  . 0.9 % NaCl with KCl 20 mEq / L 75 mL/hr at 03/26/15 0900    PRN Meds: acetaminophen **OR** acetaminophen, ALPRAZolam, alum & mag hydroxide-simeth, bisacodyl, feeding supplement (ENSURE ENLIVE), HYDROcodone-acetaminophen, labetalol, LORazepam, morphine injection, morphine injection, ondansetron **OR** ondansetron (ZOFRAN) IV, polyethylene  glycol, promethazine, promethazine  Palliative Performance Scale: 30 %     Vital Signs: BP 119/70 mmHg  Pulse 70  Temp(Src) 97.6 F (36.4 C) (Oral)  Resp 17  Ht '5\' 8"'$  (1.727 m)  Wt 44.589 kg (98 lb 4.8 oz)  BMI 14.95 kg/m2  SpO2 93% SpO2: SpO2: 93 % O2 Device: O2 Device: Not Delivered O2 Flow Rate: O2 Flow Rate (L/min): 2 L/min  Intake/output summary:  Intake/Output Summary (Last 24 hours) at 03/26/15 1057 Last data filed at 03/26/15 0900  Gross per 24 hour  Intake   1855 ml  Output   2075 ml  Net   -220 ml   LBM: Last BM Date: 03/17/15 (per pt) Baseline Weight: Weight: 44.589 kg (98 lb 4.8 oz) Most recent weight: Weight: 44.589 kg (98 lb 4.8 oz)  Physical Exam:             General: chronically ill appearing, NAD, pale HEENT: moist buccal membranes  CVS: RRR Resp: CTA Abd:  Soft NT    Additional Data Reviewed: No results for input(s): WBC, HGB, PLT, NA, BUN, CREATININE, ALB in the last 72 hours.   Problem List:  Patient Active Problem List   Diagnosis Date Noted  . Brain condition 03/24/2015  . DNR (do not resuscitate) 03/23/2015  . Weakness generalized 03/23/2015  . Palliative care encounter 03/23/2015  . Metastasis to brain 03/22/2015  . Dehydration with hyponatremia 03/22/2015  . Nausea without vomiting 03/22/2015  . Brain  mass 03/22/2015  . 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 Hypotension 03/12/2015  . CAD (coronary artery disease) 03/12/2015  . Tobacco abuse 03/12/2015  . EKG abnormalities 03/12/2015  . Severe protein-calorie malnutrition 03/12/2015  . Weakness 03/12/2015  . History of bladder cancer   . History of bone cancer   . Malignant neoplasm of upper lobe of left lung   . Mass in chest   . S/P CABG x 5 02/23/2015  . Lung mass left upper lobe with chest wall and mediastinal invasion 02/23/2015      Palliative Care Assessment & Plan    Code Status:  DNR  Prognosis: Likely less than 6 months    Discharge Planning: [facility] for rehab with Palliative care service follow-up   Care plan was discussed with Danelle Earthly LCSW  Thank you for allowing the Palliative Medicine Team to assist in the care of this patient.   Time In: 1220 Time Out: 1255 Total Time 35 min Prolonged Time Billed  no     Greater than 50%  of this time was spent counseling and coordinating care related to the above assessment and plan.   Knox Royalty, NP  03/26/2015, 10:57 AM    Please contact Palliative Medicine Team phone at [phone] for questions and concerns.

## 2015-03-27 NOTE — Progress Notes (Signed)
Doing well postoperatively. No headache. No wound issues. Awake and alert. Oriented and appropriate. Motor and sensory function stable. Wound clean and dry.  Status post suboccipital craniectomy with resection of tumor. Patient progressing well. Continue current efforts.

## 2015-03-27 NOTE — Clinical Social Work Note (Addendum)
Clinical Social Work Assessment  Patient Details  Name: Gilbert Williams MRN: [id number] Date of Birth: 1961-06-07  Date of referral:  03/26/15               Reason for consult:  Facility Placement                Permission sought to share information with:  Family Supports Permission granted to share information::  Yes, Verbal Permission Granted  Name::      Manuela Schwartz  Agency::     Relationship::   Sister  Contact Information:   [phone]  Housing/Transportation Living arrangements for the past 2 months:  Mobile Home Source of Information:  Patient Patient Interpreter Needed:  None Criminal Activity/Legal Involvement Pertinent to Current Situation/Hospitalization:  No - Comment as needed Significant Relationships:  Siblings Lives with:  Self Do you feel safe going back to the place where you live?  Yes Need for family participation in patient care:  Yes (Comment)  Care giving concerns: CSW met with patient in order to discuss potential discharge plans. Patient states that he is agreeable to going to SNF for short term rehab. This CSW provided patient with a list of facilities and patient requested that his sister be called. Patient called sister on phone and CSW placed sister on speaker. Sister stated that they would prefer a facility in or near [place], prefer [facility] because a family member is already there.    Social Worker assessment / plan:  CSW encouraged family to consider options beyond [place] as potential backup. Family agreed. CSW shared that he would explore [place] and nearby parts of [place].   Employment status:  Disabled (Comment on whether or not currently receiving Disability) Insurance information:  Programmer, applications, Medicaid In [place] PT Recommendations:  [place] / Referral to community resources:     Patient/Family's Response to care:  Family and patient agreeable to plan   Patient/Family's Understanding of and  Emotional Response to Diagnosis, Current Treatment, and Prognosis: Family and patient looking forward to patient returning to independce  Emotional Assessment Appearance:  Appears older than stated age Attitude/Demeanor/Rapport:   (Pleasant) Affect (typically observed):  Accepting, Appropriate Orientation:  Oriented to Self, Oriented to Place, Oriented to  Time, Oriented to Situation Alcohol / Substance use:  Not Applicable Psych involvement (Current and /or in the community):  No (Comment)  Discharge Needs  Concerns to be addressed:  No discharge needs identified Readmission within the last 30 days:  No Current discharge risk:  None Barriers to Discharge:  No Barriers Identified   Hilary Milks, Daneil Dolin, LCSW 03/27/2015, 1:35 PM

## 2015-03-28 MED ORDER — PANTOPRAZOLE SODIUM 40 MG PO TBEC
40.0000 mg | DELAYED_RELEASE_TABLET | Freq: Every day | ORAL | Status: DC
  Administered 2015-03-28: 40 mg via ORAL
  Filled 2015-03-28: qty 1

## 2015-03-28 MED ORDER — VITAMIN B-1 100 MG PO TABS
100.0000 mg | ORAL_TABLET | Freq: Every day | ORAL | Status: DC
  Administered 2015-03-29: 100 mg via ORAL
  Filled 2015-03-28: qty 1

## 2015-03-28 MED ORDER — FOLIC ACID 1 MG PO TABS
1.0000 mg | ORAL_TABLET | Freq: Every day | ORAL | Status: DC
  Administered 2015-03-29: 1 mg via ORAL
  Filled 2015-03-28: qty 1

## 2015-03-28 NOTE — Progress Notes (Signed)
Patient ID: Gilbert Williams, male   DOB: Apr 06, 1961, 54 y.o.   MRN: [id number] Subjective:  The patient is alert and pleasant. He is in no apparent distress.  Objective: Vital signs in last 24 hours: Temp:  [98 F (36.7 C)-98.4 F (36.9 C)] 98 F (36.7 C) (06/19 0952) Pulse Rate:  [74-80] 78 (06/19 0952) Resp:  [18-20] 20 (06/19 0952) BP: (85-107)/(57-64) 85/57 mmHg (06/19 0952) SpO2:  [90 %-99 %] 99 % (06/19 0952)  Intake/Output from previous day: 06/18 0701 - 06/19 0700 In: -  Out: 950 [Urine:950] Intake/Output this shift:    Physical exam the patient is alert and oriented. His dressing is clean and dry. He is moving all 4 extremities well.  Lab Results: No results for input(s): WBC, HGB, HCT, PLT in the last 72 hours. BMET No results for input(s): NA, K, CL, CO2, GLUCOSE, BUN, CREATININE, CALCIUM in the last 72 hours.  Studies/Results: Mr Mrv Head Wo Cm  03/26/2015   CLINICAL DATA:  Initial evaluation status post left suboccipital craniotomy for resection of left cerebellar metastasis.  EXAM: MR MRV HEAD WITHOUT CONTRAST  TECHNIQUE: Multiplanar, multisequence MR imaging was performed. No intravenous contrast was administered.  COMPARISON:  Prior brain MRI 03/22/2015  FINDINGS: Dedicated MRV images demonstrate normal flow related opacification of the superior sagittal, transverse, and sigmoid sinuses. Normal flow seen within the proximal internal jugular veins. Deep venous structures are patent. No evidence for venous sinus thrombosis. No complication about the dural sinuses status post recent surgery.  Postoperative changes from recent tumor resection partially visualized within the left cerebellar hemisphere. No other definite intracranial abnormality.  IMPRESSION: Normal MRV of the brain status post recent left suboccipital craniotomy for resection of left cerebellar metastasis. No evidence for dural sinus thrombosis or other acute abnormality.   Electronically Signed   By: Jeannine Boga M.D.   On: 03/26/2015 23:52    Assessment/Plan: Postop day #6: The patient is stable.  LOS: 6 days     Breana Litts D 03/28/2015, 11:30 AM

## 2015-03-29 MED ORDER — LEVETIRACETAM 500 MG PO TABS
500.0000 mg | ORAL_TABLET | Freq: Two times a day (BID) | ORAL | Status: DC
  Administered 2015-03-29: 500 mg via ORAL
  Filled 2015-03-29: qty 1

## 2015-03-29 NOTE — Clinical Social Work Note (Signed)
Clinical Social Worker facilitated patient discharge including contacting patient family and facility to confirm patient discharge plans.  Clinical information faxed to facility and family agreeable with plan.  CSW arranged ambulance transport via [facility] to [facility].  RN to call report prior to discharge.  DC packet prepared and on chart for transport with number for report.   Clinical Social Worker will sign off for now as social work intervention is no longer needed. Please consult [place] again if new need arises.  Glendon Axe, MSW, LCSWA [phone] 03/29/2015 4:06 PM

## 2015-03-29 NOTE — Progress Notes (Signed)
Subjective: Patient reports "I'm ok I guess...no change really(appetite)"  Objective: Vital signs in last 24 hours: Temp:  [97.8 F (36.6 C)-98.8 F (37.1 C)] 97.8 F (36.6 C) (06/20 0634) Pulse Rate:  [70-78] 75 (06/20 0634) Resp:  [18-20] 18 (06/20 0634) BP: (85-104)/(55-67) 91/55 mmHg (06/20 0634) SpO2:  [93 %-99 %] 94 % (06/20 0634)  Intake/Output from previous day: 06/19 0701 - 06/20 0700 In: -  Out: 2375 [Urine:2375] Intake/Output this shift:    Awake, alert, pleasant demeanor this morning. MAEW.  Asymptomatic of 91/55 6am BP.  Drsg intact, dry. Appetite remains poor.   Lab Results: No results for input(s): WBC, HGB, HCT, PLT in the last 72 hours. BMET No results for input(s): NA, K, CL, CO2, GLUCOSE, BUN, CREATININE, CALCIUM in the last 72 hours.  Studies/Results: No results found.  Assessment/Plan: Doing well   LOS: 7 days  Pt agrees to plan for SNF with [facility] follow up. Per DrStern, d/c IV, d/c to SNF.     Verdis Prime 03/29/2015, 9:01 AM

## 2015-03-29 NOTE — Progress Notes (Signed)
Physical Therapy Treatment Patient Details Name: Gilbert Williams MRN: [id number] DOB: 05-25-1961 Today's Date: 03/29/2015    History of Present Illness 54 -year-old male with past medical history of coronary artery disease, status post CABG x 5 in 2009, history of bladder cancer, bone cancer status post right AKA, recently diagnosed with lung cancer, recent hospitalization earlier in June 2016 for hypotension, dizziness. Patient follows outpatient Dr. Julien Williams and the initial plan was to begin chemotherapy however MRI of the brain has revealed left cerebellar mass with partial necrosis and surrounding vasogenic edema.  Pt s/p left suboccipital craniotomy for excision of tumor.    PT Comments    Pt with improved OOB tolerance however did experience light-headedness with ambulation requiring seated rest break. Pt extremely irritated, RN tech reports this to be his baseline according to his sister. Con't to recommend ST-SNF due to pt unsafe to return home and care for self indep.  Follow Up Recommendations  SNF     Equipment Recommendations       Recommendations for Other Services       Precautions / Restrictions Precautions Precautions: Fall Precaution Comments: R hi disarticulation Restrictions Weight Bearing Restrictions: No    Mobility  Bed Mobility Overal bed mobility: Modified Independent Bed Mobility: Supine to Sit     Supine to sit: Modified independent (Device/Increase time)     General bed mobility comments: used bed rail but was able to get self to EOB safely  Transfers Overall transfer level: Needs assistance Equipment used: Rolling walker (2 wheeled) Transfers: Sit to/from Stand Sit to Stand: Min guard         General transfer comment: pt irritated and slightly impulsive, v/c's to push up from bed  Ambulation/Gait Ambulation/Gait assistance: Min guard Ambulation Distance (Feet): 30 Feet (x2, limited by lightheadedness and irritation) Assistive device:  Rolling walker (2 wheeled) Gait Pattern/deviations: Step-to pattern   Gait velocity interpretation: Below normal speed for age/gender General Gait Details: pt with safe speed and technique, no episodes of LOB   Stairs            Wheelchair Mobility    Modified Rankin (Stroke Patients Only)       Balance Overall balance assessment: Needs assistance           Standing balance-Leahy Scale: Poor Standing balance comment: needs Rolling walker due to R LE AKA                    Cognition Arousal/Alertness: Awake/alert Behavior During Therapy: Agitated Overall Cognitive Status:  (easily distracted, very short and irritated)                      Exercises      General Comments        Pertinent Vitals/Pain Pain Assessment: No/denies pain    Home Living Family/patient expects to be discharged to:: Skilled nursing facility                    Prior Function            PT Goals (current goals can now be found in the care plan section) Progress towards PT goals: Progressing toward goals    Frequency  Min 3X/week    PT Plan Current plan remains appropriate    Co-evaluation             End of Session Equipment Utilized During Treatment: Gait belt Activity Tolerance: Patient tolerated treatment well Patient left:  in chair;with call bell/phone within reach     Time: 1435-1447 PT Time Calculation (min) (ACUTE ONLY): 12 min  Charges:  $Gait Training: 8-22 mins                    G Codes:      Gilbert Williams 03/29/2015, 3:56 PM  Gilbert Williams, PT, DPT Pager #: [phone] Office #: [phone]

## 2015-03-29 NOTE — Clinical Social Work Placement (Signed)
   CLINICAL SOCIAL WORK PLACEMENT  NOTE  Date:  03/29/2015  Patient Details  Name: Gilbert Williams MRN: [id number] Date of Birth: September 01, 1961  Clinical Social Work is seeking post-discharge placement for this patient at the [place] level of care (*CSW will initial, date and re-position this form in  chart as items are completed):  Yes   Patient/family provided with [place] [facility] Work Department's list of facilities offering this level of care within the geographic area requested by the patient (or if unable, by the patient's family).  Yes   Patient/family informed of their freedom to choose among providers that offer the needed level of care, that participate in Medicare, Medicaid or managed care program needed by the patient, have an available bed and are willing to accept the patient.  Yes   Patient/family informed of [facility]'s ownership interest in [facility] and [facility], as well as of the fact that they are under no obligation to receive care at these facilities.  PASRR submitted to EDS on 03/29/15     PASRR number received on 03/29/15     Existing PASRR number confirmed on       FL2 transmitted to all facilities in geographic area requested by pt/family on 03/29/15     FL2 transmitted to all facilities within larger geographic area on 03/29/15     Patient informed that his/her managed care company has contracts with or will negotiate with certain facilities, including the following:   (YES )     Yes   Patient/family informed of bed offers received.  Patient chooses bed at  [facility])     Physician recommends and patient chooses bed at      Patient to be transferred to  [facility]) on 03/29/15.  Patient to be transferred to facility by  [facility] )     Patient family notified on 03/29/15 of transfer.  Name of family member notified:   (Pt's sister, Joseph Art)     PHYSICIAN        Additional Comment:    _______________________________________________ Rozell Searing, LCSW 03/29/2015, 3:49 PM

## 2015-03-29 NOTE — Discharge Summary (Signed)
Physician Discharge Summary  Patient ID: Gilbert Williams MRN: [id number] DOB/AGE: Oct 31, 1960 54 y.o.  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 Following uncomplicated craniotomy for tumor excision, he recovered well and transferred to Neuro ICU and subsequently 4N. He is doing well from the standpoint of his surgery.  Consults: None  Significant Diagnostic Studies:   Treatments: surgery: Left Suboccipital craniotomy for tumor excision (Left) - Left Suboccipital craniotomy for excision of tumor APPLICATION OF CRANIAL NAVIGATION (N/A)   Discharge Exam: Blood pressure 91/55, pulse 75, temperature 97.8 F (36.6 C), temperature source Oral, resp. rate 18, height '5\' 8"'$  (1.727 m), weight 44.589 kg (98 lb 4.8 oz), SpO2 94 %. Awake, alert, pleasant demeanor this morning. MAEW.  Asymptomatic of 91/55 6am BP.  Drsg intact, dry. Appetite remains poor.    Disposition: Discharge to SNF. Remove sutures in 2 weeks at SNF with office follow up in 1 month.  Norco 5/325 1 po up to QID prn pain #30 to chart.     Medication List    ASK your doctor about these medications        ALPRAZolam 0.25 MG tablet  Commonly known as:  XANAX  Take 0.25 mg by mouth at bedtime.     aspirin EC 81 MG tablet  Take 81 mg by mouth daily.     carvedilol 12.5 MG tablet  Commonly known as:  COREG  Take 12.5 mg by mouth 2 (two) times daily with a meal.     dexamethasone 4 MG tablet  Commonly known as:  DECADRON  Take 1 tablet (4 mg total) by mouth 4 (four) times daily.     losartan 50 MG tablet  Commonly known as:  COZAAR  Take 50 mg by mouth daily.     traMADol 50 MG tablet  Commonly known as:  ULTRAM  Take 1 tablet (50 mg total) by mouth every 6 (six) hours as needed for moderate pain.         Signed: Verdis Prime 03/29/2015, 3:31 PM

## 2015-03-29 NOTE — Clinical Social Work Note (Signed)
Patient has a bed at [facility] [place].  Glendon Axe, MSW, LCSWA [phone] 03/29/2015 1:19 PM

## 2015-03-30 ENCOUNTER — Other Ambulatory Visit: Payer: Self-pay | Admitting: Internal Medicine

## 2015-03-30 ENCOUNTER — Non-Acute Institutional Stay (SKILLED_NURSING_FACILITY): Payer: Medicare HMO | Admitting: Internal Medicine

## 2015-03-30 ENCOUNTER — Telehealth: Payer: Self-pay | Admitting: Internal Medicine

## 2015-03-30 ENCOUNTER — Encounter: Payer: Self-pay | Admitting: Internal Medicine

## 2015-03-30 DIAGNOSIS — R531 Weakness: Secondary | ICD-10-CM | POA: Diagnosis not present

## 2015-03-30 DIAGNOSIS — E43 Unspecified severe protein-calorie malnutrition: Secondary | ICD-10-CM

## 2015-03-30 DIAGNOSIS — C7931 Secondary malignant neoplasm of brain: Secondary | ICD-10-CM | POA: Diagnosis not present

## 2015-03-30 DIAGNOSIS — Z8551 Personal history of malignant neoplasm of bladder: Secondary | ICD-10-CM

## 2015-03-30 DIAGNOSIS — I959 Hypotension, unspecified: Secondary | ICD-10-CM

## 2015-03-30 DIAGNOSIS — C3412 Malignant neoplasm of upper lobe, left bronchus or lung: Secondary | ICD-10-CM | POA: Diagnosis not present

## 2015-03-30 DIAGNOSIS — Z85830 Personal history of malignant neoplasm of bone: Secondary | ICD-10-CM

## 2015-03-30 DIAGNOSIS — Z720 Tobacco use: Secondary | ICD-10-CM

## 2015-03-30 DIAGNOSIS — Z951 Presence of aortocoronary bypass graft: Secondary | ICD-10-CM

## 2015-03-30 NOTE — Progress Notes (Signed)
Patient ID: Gilbert Williams, male   DOB: 24-Sep-1961, 54 y.o.   MRN: [id number]    HISTORY AND PHYSICAL   DATE: 03/30/15  Location:  [facility]    Place of Service: SNF [phone])   Extended Emergency Contact Information Primary Emergency Contact: [place] Phone: [phone] Relation: Sister  Advanced Directive information  DNR  Chief Complaint  Patient presents with  . New Admit To SNF    HPI:  54 yo male seen today as a new admission into SNF following hospital stay for lung ca with mets to brain s/p left occipital craniotomy to remove large met lesion and generalized weakness. He has a hx bone cancer in the past and is s/p right AKA.he is confused today and does not understand why he is in SNF or what he is supposed to be doing. Hx obtained from chart as pt is a poor historian  Hospital records reviewed. Brain met was 5 cm in posterior fossa  Tobacco abuse - current smoker  Past Medical History  Diagnosis Date  . Coronary artery disease   . Hypertension   . Bladder cancer     x2  . Myocardial infarction 2009  . Lung cancer   . Bone cancer     Past Surgical History  Procedure Laterality Date  . Coronary artery bypass graft  2009  . Leg amputation above knee Right 1981  . Heart stent    . Craniotomy Left 03/24/2015    Procedure: Left Suboccipital craniotomy for tumor excision;  Surgeon: Erline Levine, MD;  Location: [place] [facility];  Service: Neurosurgery;  Laterality: Left;  Left Suboccipital craniotomy for excision of tumor  . Application of cranial navigation N/A 03/24/2015    Procedure: APPLICATION OF CRANIAL NAVIGATION;  Surgeon: Erline Levine, MD;  Location: [place] [facility];  Service: Neurosurgery;  Laterality: N/A;    Patient Care Team: Daryel Gerald, MD as PCP - General (Family Medicine) Sherryl Barters, MD as Consulting Physician (Cardiology) Grace Isaac, MD as Consulting Physician (Cardiothoracic  Surgery) Karie Chimera, MD as Consulting Physician (Neurosurgery)  History   Social History  . Marital Status: Married    Spouse Name: N/A  . Number of Children: 2  . Years of Education: N/A   Occupational History  . Disabled.    Social History Main Topics  . Smoking status: Current Every Day Smoker -- 1.00 packs/day for 36 years    Types: Cigarettes    Start date: 02/23/1979  . Smokeless tobacco: Never Used  . Alcohol Use: No     Comment: Quit drinking 6 months ago.  . Drug Use: Yes     Comment: Marijuana  . Sexual Activity: Not on file   Other Topics Concern  . Not on file   Social History Narrative   Divorced. Lives alone.  Ambulates independently.     reports that he has been smoking Cigarettes.  He started smoking about 36 years ago. He has a 36 pack-year smoking history. He has never used smokeless tobacco. He reports that he uses illicit drugs. He reports that he does not drink alcohol.  Family History  Problem Relation Age of Onset  . Heart failure Mother     Died at 84  . COPD Mother   . Heart attack Father     Died at 35  . Peripheral vascular disease Sister   . Asthma Sister   . Hypertension Sister   . Diabetes  Sister    Family Status  Relation Status Death Age  . Mother Deceased   . Father Deceased   . Sister Alive   . Sister Alive   . Sister Alive      There is no immunization history on file for this patient.  Allergies  Allergen Reactions  . Meperidine And Related Other (See Comments)    Demerol////Happened with his amputation surgery,was told he had a bad reaction     Medications: Patient's Medications  New Prescriptions   No medications on file  Previous Medications   ALPRAZOLAM (XANAX) 0.25 MG TABLET    Take 0.25 mg by mouth at bedtime.   ASPIRIN EC 81 MG TABLET    Take 81 mg by mouth daily.   CARVEDILOL (COREG) 12.5 MG TABLET    Take 12.5 mg by mouth 2 (two) times daily with a meal.    DEXAMETHASONE (DECADRON) 4 MG TABLET     Take 1 tablet (4 mg total) by mouth 4 (four) times daily.   LOSARTAN (COZAAR) 50 MG TABLET    Take 50 mg by mouth daily.    TRAMADOL (ULTRAM) 50 MG TABLET    Take 1 tablet (50 mg total) by mouth every 6 (six) hours as needed for moderate pain.  Modified Medications   No medications on file  Discontinued Medications   No medications on file    Review of Systems  Unable to perform ROS: Mental status change    Filed Vitals:   03/30/15 1624  BP: 82/63  Pulse: 84  Temp: 97.4 F (36.3 C)  Weight: 98 lb 8 oz (44.679 kg)  SpO2: 96%   Body mass index is 14.98 kg/(m^2).  Physical Exam  Constitutional: He appears well-developed.  Frail appearing, looks ill in NAD  HENT:  Head:    Mouth/Throat: Oropharynx is clear and moist.  Eyes: Pupils are equal, round, and reactive to light. No scleral icterus.  Neck: Neck supple. Carotid bruit is not present. No thyromegaly present.  Cardiovascular: Normal rate, regular rhythm, normal heart sounds and intact distal pulses.  Exam reveals no gallop and no friction rub.   No murmur heard. no distal LE swelling. No calf TTP  Pulmonary/Chest: Effort normal and breath sounds normal. No accessory muscle usage. No respiratory distress. He has no wheezes. He has no rales. He exhibits no tenderness.  Markedly reduced BS at base b/l  with end expiratory scattered wheezing  Abdominal: Soft. Bowel sounds are normal. He exhibits no distension, no abdominal bruit, no pulsatile midline mass and no mass. There is no tenderness. There is no rebound and no guarding.  Musculoskeletal: He exhibits edema and tenderness.  Right AKA  Lymphadenopathy:    He has no cervical adenopathy.  Neurological: He is alert.  Skin: Skin is warm and dry. No rash noted.  Psychiatric: His mood appears anxious. He is aggressive.     Labs reviewed: Admission on 03/22/2015, Discharged on 03/29/2015  Component Date Value Ref Range Status  . WBC 03/22/2015 9.5  4.0 - 10.5 K/uL Final   . RBC 03/22/2015 4.59  4.22 - 5.81 MIL/uL Final  . Hemoglobin 03/22/2015 13.0  13.0 - 17.0 g/dL Final  . HCT 03/22/2015 38.6* 39.0 - 52.0 % Final  . MCV 03/22/2015 84.1  78.0 - 100.0 fL Final  . MCH 03/22/2015 28.3  26.0 - 34.0 pg Final  . MCHC 03/22/2015 33.7  30.0 - 36.0 g/dL Final  . RDW 03/22/2015 16.4* 11.5 - 15.5 % Final  .  Platelets 03/22/2015 482* 150 - 400 K/uL Final  . Neutrophils Relative % 03/22/2015 88* 43 - 77 % Final  . Neutro Abs 03/22/2015 8.5* 1.7 - 7.7 K/uL Final  . Lymphocytes Relative 03/22/2015 8* 12 - 46 % Final  . Lymphs Abs 03/22/2015 0.7  0.7 - 4.0 K/uL Final  . Monocytes Relative 03/22/2015 3  3 - 12 % Final  . Monocytes Absolute 03/22/2015 0.3  0.1 - 1.0 K/uL Final  . Eosinophils Relative 03/22/2015 1  0 - 5 % Final  . Eosinophils Absolute 03/22/2015 0.1  0.0 - 0.7 K/uL Final  . Basophils Relative 03/22/2015 0  0 - 1 % Final  . Basophils Absolute 03/22/2015 0.0  0.0 - 0.1 K/uL Final  . Sodium 03/22/2015 131* 135 - 145 mmol/L Final  . Potassium 03/22/2015 4.0  3.5 - 5.1 mmol/L Final  . Chloride 03/22/2015 96* 101 - 111 mmol/L Final  . CO2 03/22/2015 23  22 - 32 mmol/L Final  . Glucose, Bld 03/22/2015 92  65 - 99 mg/dL Final  . BUN 03/22/2015 8  6 - 20 mg/dL Final  . Creatinine, Ser 03/22/2015 0.61  0.61 - 1.24 mg/dL Final  . Calcium 03/22/2015 9.0  8.9 - 10.3 mg/dL Final  . Total Protein 03/22/2015 6.7  6.5 - 8.1 g/dL Final  . Albumin 03/22/2015 3.2* 3.5 - 5.0 g/dL Final  . AST 03/22/2015 22  15 - 41 U/L Final  . ALT 03/22/2015 9* 17 - 63 U/L Final  . Alkaline Phosphatase 03/22/2015 113  38 - 126 U/L Final  . Total Bilirubin 03/22/2015 1.0  0.3 - 1.2 mg/dL Final  . GFR calc non Af Amer 03/22/2015 >60  >60 mL/min Final  . GFR calc Af Amer 03/22/2015 >60  >60 mL/min Final   Comment: (NOTE) The eGFR has been calculated using the CKD EPI equation. This calculation has not been validated in all clinical situations. eGFR's persistently <60 mL/min signify  possible Chronic Kidney Disease.   . Anion gap 03/22/2015 12  5 - 15 Final  . Prothrombin Time 03/22/2015 15.6* 11.6 - 15.2 seconds Final  . INR 03/22/2015 1.22  0.00 - 1.49 Final  . aPTT 03/22/2015 33  24 - 37 seconds Final  . MRSA, PCR 03/24/2015 NEGATIVE  NEGATIVE Final  . Staphylococcus aureus 03/24/2015 NEGATIVE  NEGATIVE Final   Comment:        The Xpert SA Assay (FDA approved for NASAL specimens in patients over 53 years of age), is one component of a comprehensive surveillance program.  Test performance has been validated by [facility] for patients greater than or equal to 52 year old. It is not intended to diagnose infection nor to guide or monitor treatment.   Appointment on 03/18/2015  Component Date Value Ref Range Status  . WBC 03/18/2015 8.8  4.0 - 10.3 10e3/uL Final  . NEUT# 03/18/2015 5.9  1.5 - 6.5 10e3/uL Final  . HGB 03/18/2015 12.1* 13.0 - 17.1 g/dL Final  . HCT 03/18/2015 36.8* 38.4 - 49.9 % Final  . Platelets 03/18/2015 495* 140 - 400 10e3/uL Final  . MCV 03/18/2015 84.8  79.3 - 98.0 fL Final  . MCH 03/18/2015 27.9  27.2 - 33.4 pg Final  . MCHC 03/18/2015 32.9  32.0 - 36.0 g/dL Final  . RBC 03/18/2015 4.34  4.20 - 5.82 10e6/uL Final  . RDW 03/18/2015 16.6* 11.0 - 14.6 % Final  . lymph# 03/18/2015 1.8  0.9 - 3.3 10e3/uL Final  . MONO#  03/18/2015 0.9  0.1 - 0.9 10e3/uL Final  . Eosinophils Absolute 03/18/2015 0.2  0.0 - 0.5 10e3/uL Final  . Basophils Absolute 03/18/2015 0.0  0.0 - 0.1 10e3/uL Final  . NEUT% 03/18/2015 67.2  39.0 - 75.0 % Final  . LYMPH% 03/18/2015 20.5  14.0 - 49.0 % Final  . MONO% 03/18/2015 9.8  0.0 - 14.0 % Final  . EOS% 03/18/2015 2.1  0.0 - 7.0 % Final  . BASO% 03/18/2015 0.4  0.0 - 2.0 % Final  . Sodium 03/18/2015 132* 136 - 145 mEq/L Final  . Potassium 03/18/2015 4.2  3.5 - 5.1 mEq/L Final  . Chloride 03/18/2015 98  98 - 109 mEq/L Final  . CO2 03/18/2015 20* 22 - 29 mEq/L Final  . Glucose 03/18/2015 86  70 - 140 mg/dl Final    . BUN 03/18/2015 8.9  7.0 - 26.0 mg/dL Final  . Creatinine 03/18/2015 0.6* 0.7 - 1.3 mg/dL Final  . Total Bilirubin 03/18/2015 0.77  0.20 - 1.20 mg/dL Final  . Alkaline Phosphatase 03/18/2015 123  40 - 150 U/L Final  . AST 03/18/2015 22  5 - 34 U/L Final  . ALT 03/18/2015 <6  0 - 55 U/L Final  . Total Protein 03/18/2015 6.9  6.4 - 8.3 g/dL Final  . Albumin 03/18/2015 3.0* 3.5 - 5.0 g/dL Final  . Calcium 03/18/2015 9.0  8.4 - 10.4 mg/dL Final  . Anion Gap 03/18/2015 13* 3 - 11 mEq/L Final  . EGFR 03/18/2015 >90  >90 ml/min/1.73 m2 Final   eGFR is calculated using the CKD-EPI Creatinine Equation (2009)  Hospital Outpatient Visit on 03/18/2015  Component Date Value Ref Range Status  . Glucose-Capillary 03/18/2015 96  65 - 99 mg/dL Final  Admission on 03/12/2015, Discharged on 03/13/2015  Component Date Value Ref Range Status  . WBC 03/12/2015 9.3  4.0 - 10.5 K/uL Final  . RBC 03/12/2015 4.25  4.22 - 5.81 MIL/uL Final  . Hemoglobin 03/12/2015 11.8* 13.0 - 17.0 g/dL Final  . HCT 03/12/2015 36.7* 39.0 - 52.0 % Final  . MCV 03/12/2015 86.4  78.0 - 100.0 fL Final  . MCH 03/12/2015 27.8  26.0 - 34.0 pg Final  . MCHC 03/12/2015 32.2  30.0 - 36.0 g/dL Final  . RDW 03/12/2015 15.8* 11.5 - 15.5 % Final  . Platelets 03/12/2015 475* 150 - 400 K/uL Final  . Neutrophils Relative % 03/12/2015 71  43 - 77 % Final  . Neutro Abs 03/12/2015 6.6  1.7 - 7.7 K/uL Final  . Lymphocytes Relative 03/12/2015 17  12 - 46 % Final  . Lymphs Abs 03/12/2015 1.6  0.7 - 4.0 K/uL Final  . Monocytes Relative 03/12/2015 8  3 - 12 % Final  . Monocytes Absolute 03/12/2015 0.7  0.1 - 1.0 K/uL Final  . Eosinophils Relative 03/12/2015 3  0 - 5 % Final  . Eosinophils Absolute 03/12/2015 0.3  0.0 - 0.7 K/uL Final  . Basophils Relative 03/12/2015 1  0 - 1 % Final  . Basophils Absolute 03/12/2015 0.1  0.0 - 0.1 K/uL Final  . Sodium 03/12/2015 132* 135 - 145 mmol/L Final  . Potassium 03/12/2015 4.4  3.5 - 5.1 mmol/L Final  .  Chloride 03/12/2015 95* 101 - 111 mmol/L Final  . CO2 03/12/2015 24  22 - 32 mmol/L Final  . Glucose, Bld 03/12/2015 107* 65 - 99 mg/dL Final  . BUN 03/12/2015 10  6 - 20 mg/dL Final  . Creatinine, Ser 03/12/2015 0.56* 0.61 -  1.24 mg/dL Final  . Calcium 03/12/2015 9.1  8.9 - 10.3 mg/dL Final  . Total Protein 03/12/2015 7.1  6.5 - 8.1 g/dL Final  . Albumin 03/12/2015 3.3* 3.5 - 5.0 g/dL Final  . AST 03/12/2015 20  15 - 41 U/L Final  . ALT 03/12/2015 10* 17 - 63 U/L Final  . Alkaline Phosphatase 03/12/2015 112  38 - 126 U/L Final  . Total Bilirubin 03/12/2015 0.7  0.3 - 1.2 mg/dL Final  . GFR calc non Af Amer 03/12/2015 >60  >60 mL/min Final  . GFR calc Af Amer 03/12/2015 >60  >60 mL/min Final   Comment: (NOTE) The eGFR has been calculated using the CKD EPI equation. This calculation has not been validated in all clinical situations. eGFR's persistently <60 mL/min signify possible Chronic Kidney Disease.   . Anion gap 03/12/2015 13  5 - 15 Final  . Lactic Acid, Venous 03/12/2015 1.79  0.5 - 2.0 mmol/L Final  . Color, Urine 03/12/2015 AMBER* YELLOW Final   BIOCHEMICALS MAY BE AFFECTED BY COLOR  . APPearance 03/12/2015 CLEAR  CLEAR Final  . Specific Gravity, Urine 03/12/2015 1.013  1.005 - 1.030 Final  . pH 03/12/2015 6.0  5.0 - 8.0 Final  . Glucose, UA 03/12/2015 NEGATIVE  NEGATIVE mg/dL Final  . Hgb urine dipstick 03/12/2015 NEGATIVE  NEGATIVE Final  . Bilirubin Urine 03/12/2015 SMALL* NEGATIVE Final  . Ketones, ur 03/12/2015 40* NEGATIVE mg/dL Final  . Protein, ur 03/12/2015 NEGATIVE  NEGATIVE mg/dL Final  . Urobilinogen, UA 03/12/2015 2.0* 0.0 - 1.0 mg/dL Final  . Nitrite 03/12/2015 NEGATIVE  NEGATIVE Final  . Leukocytes, UA 03/12/2015 NEGATIVE  NEGATIVE Final   MICROSCOPIC NOT DONE ON URINES WITH NEGATIVE PROTEIN, BLOOD, LEUKOCYTES, NITRITE, OR GLUCOSE <1000 mg/dL.  Marland Kitchen Prothrombin Time 03/12/2015 14.9  11.6 - 15.2 seconds Final  . INR 03/12/2015 1.15  0.00 - 1.49 Final  . aPTT  03/12/2015 33  24 - 37 seconds Final  . Specimen Description 03/12/2015 BLOOD RIGHT ANTECUBITAL   Final  . Special Requests 03/12/2015 BOTTLES DRAWN AEROBIC AND ANAEROBIC 4.5CC EACH   Final  . Culture 03/12/2015    Final                   Value:NO GROWTH 5 DAYS Performed at [facility]   . Report Status 03/12/2015 03/18/2015 FINAL   Final  . Specimen Description 03/12/2015 BLOOD RIGHT ARM   Final  . Special Requests 03/12/2015 BOTTLES DRAWN AEROBIC AND ANAEROBIC 5CC EACH   Final  . Culture 03/12/2015    Final                   Value:NO GROWTH 5 DAYS Performed at [facility]   . Report Status 03/12/2015 03/18/2015 FINAL   Final  . Troponin I 03/12/2015 <0.03  <0.031 ng/mL Final   Comment:        NO INDICATION OF MYOCARDIAL INJURY.   . Troponin I 03/12/2015 <0.03  <0.031 ng/mL Final   Comment:        NO INDICATION OF MYOCARDIAL INJURY.   . Troponin I 03/12/2015 <0.03  <0.031 ng/mL Final   Comment:        NO INDICATION OF MYOCARDIAL INJURY.   . Troponin I 03/13/2015 <0.03  <0.031 ng/mL Final   Comment:        NO INDICATION OF MYOCARDIAL INJURY.   Hospital Outpatient Visit on 03/10/2015  Component Date Value Ref Range Status  . aPTT  03/10/2015 32  24 - 37 seconds Final  . WBC 03/10/2015 8.3  4.0 - 10.5 K/uL Final  . RBC 03/10/2015 4.55  4.22 - 5.81 MIL/uL Final  . Hemoglobin 03/10/2015 12.9* 13.0 - 17.0 g/dL Final  . HCT 03/10/2015 38.1* 39.0 - 52.0 % Final  . MCV 03/10/2015 83.7  78.0 - 100.0 fL Final  . MCH 03/10/2015 28.4  26.0 - 34.0 pg Final  . MCHC 03/10/2015 33.9  30.0 - 36.0 g/dL Final  . RDW 03/10/2015 16.0* 11.5 - 15.5 % Final  . Platelets 03/10/2015 464* 150 - 400 K/uL Final  . Prothrombin Time 03/10/2015 14.5  11.6 - 15.2 seconds Final  . INR 03/10/2015 1.11  0.00 - 1.49 Final    Dg Chest 1 View  03/10/2015   CLINICAL DATA:  Post biopsy  EXAM: CHEST  1 VIEW  COMPARISON:  02/23/2015  FINDINGS: No pneumothorax post biopsy. Left upper lobe  mass and left upper lobe collapse are stable. Hyperaeration.  IMPRESSION: No pneumothorax post biopsy.   Electronically Signed   By: Marybelle Killings M.D.   On: 03/10/2015 13:44   Ct Chest Wo Contrast  03/04/2015   CLINICAL DATA:  Left suprahilar mass on chest x-ray. Mid chest pain. Smoker. Remote history of right lower extremity bone cancer.  EXAM: CT CHEST WITHOUT CONTRAST  TECHNIQUE: Multidetector CT imaging of the chest was performed following the standard protocol without IV contrast.  COMPARISON:  Chest radiograph of 02/23/2015. Remote chest CT of 08/11/2003.  FINDINGS: Mediastinum/Nodes: Mild bilateral gynecomastia. Aortic and branch vessel atherosclerosis. Prior median sternotomy. Mild motion degradation. Mild cardiomegaly. Multivessel coronary artery atherosclerosis.  11 mm AP window node on image 26 is suspicious. A precarinal node measures 1.0 cm on image 27. No dominant right hilar adenopathy.  Lungs/Pleura: Small left pleural effusion. Moderate centrilobular emphysema. A Mass replaces the left upper lobe, measuring on the order of 9.5 x 8.6 cm on image 26 of series 6. 10.6 cm craniocaudal. Direct extension to the left hilum and left side of the mediastinum. Example on image 26 of series 3 into the left side of the mediastinum. Left upper lobe in obstruction with significant mass effect on the lingular and left lower lobe bronchi. No gross osseous destruction to confirm chest wall invasion.  Satellite parenchymal or pleural-based nodules including at 1.8 and 1.7 cm anteriorly and inferiorly on image 37 of series 3.  Upper abdomen: Motion degradation continuing. Marked right renal atrophy. Grossly normal imaged portions the liver, spleen, stomach, pancreas, adrenal glands. Retroaortic left renal vein.  Musculoskeletal: No acute osseous abnormality.  IMPRESSION: 1. Mass replacing the majority of the left upper lobe with direct mediastinal extension, consistent with primary bronchogenic carcinoma. 2.  Enlarged mediastinal nodes are highly suspicious for metastatic disease. Pulmonary parenchymal or pleural based satellite nodules within the inferior left hemi thorax are also malignant. 3. Small left pleural effusion. 4. Degraded exam secondary to motion and lack of IV contrast. 5. Advanced atherosclerosis with centrilobular emphysema. 6. Gynecomastia.   Electronically Signed   By: Abigail Miyamoto M.D.   On: 03/04/2015 15:17   Mr Jeri Cos XV Contrast  03/22/2015   CLINICAL DATA:  Brain lab protocol. Evaluate metastasis. Symptoms of dizziness and weakness.  EXAM: MRI HEAD WITHOUT AND WITH CONTRAST  TECHNIQUE: Multiplanar, multiecho pulse sequences of the brain and surrounding structures were obtained without and with intravenous contrast.  CONTRAST:  15mL MULTIHANCE GADOBENATE DIMEGLUMINE 529 MG/ML IV SOLN  COMPARISON:  MR brain 03/18/2015.  FINDINGS: The study was done on the 1.5 tesla scanner instead of the 3T scanner because we could not confirm the safety of the patient's cardiac stents for the higher field strength unit.  LEFT cerebellar mass is redemonstrated, 4.9 x 4.4 x 3.5 cm with calcification, hemorrhage, and partial necrosis. Surrounding vasogenic edema. Significant mass effect on the dorsolateral fourth ventricle with left-to-right shift. Slight downward displacement of the LEFT tonsil. Mild ventriculomegaly and slight transependymal T2 and FLAIR hyperintensity without definite hydrocephalus. Absence of temporal horn engorgement.  Flow voids are maintained. Unchanged LEFT lentiform nucleus DVA. No additional lesions are detected. Negative orbits, sinuses, and mastoids. No evidence for dural venous sinus thrombosis or other anomaly.  IMPRESSION: Stable LEFT cerebellar mass favored to represent a metastasis.   Electronically Signed   By: Staci Righter M.D.   On: 03/22/2015 20:41   Mr Jeri Cos [place] Contrast  03/18/2015   CLINICAL DATA:  54 year old hypertensive male with history of bladder cancer, bone  cancer and recent diagnosis of lung cancer presenting with dizziness and weakness. Subsequent encounter.  EXAM: MRI HEAD WITHOUT AND WITH CONTRAST  TECHNIQUE: Multiplanar, multiecho pulse sequences of the brain and surrounding structures were obtained without and with intravenous contrast.  CONTRAST:  46mL MULTIHANCE GADOBENATE DIMEGLUMINE 529 MG/ML IV SOLN  COMPARISON:  PET CT same date.  No comparison brain MR or head CT.  FINDINGS: Exam is motion degraded (patient was claustrophobic and fast technique imaging had to be utilized).  No acute infarct.  Left cerebellar 5 x 4.4 x 3.5 cm mass with calcification/hemorrhage and partial necrosis with surrounding vasogenic edema is causing significant compression of the fourth ventricle, left aspect of the medulla, left middle cerebellar peduncle and left superior cerebellar peduncle. Minimal downward displacement of the left cerebellar peduncle. Slight ventricular prominence without significant hydrocephalus although the patient is at risk for development of such.  Findings highly concerning for solitary metastatic lesion with primary brain tumor a secondary less likely consideration.  Left lenticular nucleus/ mid corona radiata developmental venous anomaly incidentally noted.  Major intracranial vascular structures are patent.  Minimal partial opacification inferior right mastoid air cells. Minimal mucosal thickening paranasal sinuses.  Orbital structures unremarkable.  IMPRESSION: Large left cerebellar lesion suspicious for metastatic disease with surrounding vasogenic edema and significant mass effect placing patient at high risk for development of hydrocephalus. Please see above.  These results were called by telephone at the time of interpretation on 03/18/2015 at 12:25 pm to Dr. Lanelle Bal , who verbally acknowledged these results.   Electronically Signed   By: Genia Del M.D.   On: 03/18/2015 12:32   Nm Pet Image Initial (pi) Skull Base To Thigh  03/18/2015    ADDENDUM REPORT: 03/18/2015 12:40  ADDENDUM: Today's brain MRI demonstrates a large left cerebellar metastasis. This is visible on the PET-CT, demonstrating central decreased metabolic activity with a rim of hypermetabolic activity.   Electronically Signed   By: Richardean Sale M.D.   On: 03/18/2015 12:40   03/18/2015   CLINICAL DATA:  Initial treatment strategy for left upper lobe lung mass, probable adenocarcinoma on biopsy 03/10/2015.  EXAM: NUCLEAR MEDICINE PET SKULL BASE TO THIGH  TECHNIQUE: 5.22 mCi F-18 FDG was injected intravenously. Full-ring PET imaging was performed from the skull base to thigh after the radiotracer. CT data was obtained and used for attenuation correction and anatomic localization.  FASTING BLOOD GLUCOSE:  Value: 96 mg/dl  COMPARISON:  Chest CT 03/04/2015.  FINDINGS: NECK  No hypermetabolic  cervical lymph nodes are identified.There are no lesions of the pharyngeal mucosal space.  CHEST  The large central left upper lobe mass causing complete lobar collapse is markedly hypermetabolic. The lesion has an SUV max of 32. It has multiple lobulations extending into the mediastinum, periphery of the collapsed left upper lobe and potentially the anterior chest wall near the left first costomanubrial junction. The hypermetabolic component measures approximately 10.2 x 7.6 cm transverse. Tumor extends into the pre-vascular space and encases the left hilum. Separate from the mass, there are hypermetabolic prevascular and precarinal lymph nodes. The aerated lungs demonstrate moderate emphysematous changes. There are no suspicious nodules within the right lung or left lower lobe. There is a small left pleural effusion without abnormal involving CT activity. Patient is status post median sternotomy. There is diffuse atherosclerosis of the aorta, great vessels and coronary arteries.  ABDOMEN/PELVIS  There is no hypermetabolic activity within the liver, adrenal glands, spleen or pancreas. There is no  hypermetabolic nodal activity. The right kidney demonstrates diffuse cortical thinning and moderate pelvicaliceal dilatation. There is no high-grade obstruction. The urinary bladder is distended. There is an abdominal aortic aneurysm measuring up to 3.1 x 2.7 cm transverse.  SKELETON  There is a hypermetabolic lesion involving the inferior right aspect of the sacrum associated with a small soft tissue component best seen on axial image 171. This has an SUV max of 9.5 and is consistent with a metastasis. There is an additional possible small metastasis involving the left T9 pedicle near in the costovertebral junction. There is suspicion of osseous extension involving the left manubrium adjacent to the articulation with the left first rib. Right pelvic muscular atrophy noted related to previous right lower extremity amputation.  IMPRESSION: 1. The large central left upper lobe mass is markedly hypermetabolic. This mass is multilobulated with mediastinal and possible chest wall extension. Separate from the lesion, there are several hypermetabolic nodal metastases within the anterior mediastinum and precarinal stations. 2. Inferior right sacral metastasis with possible left T9 pedicle and left manubrial metastases/ direct extension. 3. No distant non osseous metastases identified.  Electronically Signed: By: Richardean Sale M.D. On: 03/18/2015 09:50   Ct Biopsy  03/10/2015   CLINICAL DATA:  Lung mass  EXAM: CT-GUIDED BIOPSY LEFT UPPER LOBE LUNG MASS  MEDICATIONS AND MEDICAL HISTORY: Versed 1.5 mg, Fentanyl 25 mcg.  Additional Medications: None.  ANESTHESIA/SEDATION: Moderate sedation time: 18 minutes  PROCEDURE: The procedure, risks, benefits, and alternatives were explained to the patient. Questions regarding the procedure were encouraged and answered. The patient understands and consents to the procedure.  The anterior upper left thorax was prepped with Betadine in a sterile fashion, and a sterile drape was applied  covering the operative field. A sterile gown and sterile gloves were used for the procedure.  Under CT guidance, a(n) 17 gauge guide needle was advanced into the left upper lobe lung mass. Subsequently four 18 gauge core biopsies were obtained. The guide needle was removed. Final imaging was performed.  Patient tolerated the procedure well without complication. Vital sign monitoring by nursing staff during the procedure will continue as patient is in the special procedures unit for post procedure observation.  FINDINGS: The images document guide needle placement within the left upper lobe lung mass. Post biopsy images demonstrate no hemorrhage.  COMPLICATIONS: None  IMPRESSION: Successful CT-guided core biopsy of a left upper lobe lung mass.   Electronically Signed   By: Marybelle Killings M.D.   On: 03/10/2015 12:47  Dg Chest Port 1 View  03/24/2015   CLINICAL DATA:  54 year old male status post central line placement  EXAM: PORTABLE CHEST - 1 VIEW  COMPARISON:  Prior chest x-ray 03/12/2015; prior PET-CT 03/18/2015  FINDINGS: New right IJ approach central venous catheter. Catheter tip overlies the mid SVC. No evidence of a pneumothorax or pleural effusion. Stable appearance of the left chest with known extensive left upper lung and mediastinal tumor as well as post obstructed atelectasis of the left upper lobe. Patient is status post median sternotomy with evidence of prior multivessel CABG. No acute osseous abnormality.  IMPRESSION: 1. Right IJ approach central venous catheter. The catheter tip overlies the mid SVC. 2. No evidence of a pneumothorax or new pleural effusion. 3. Stable left upper lobe pulmonary mass, extensive mediastinal and left hilar adenopathy and postobstructive changes in the left upper lobe.   Electronically Signed   By: Jacqulynn Cadet M.D.   On: 03/24/2015 15:47   Dg Chest Port 1 View  03/12/2015   CLINICAL DATA:  Lung carcinoma with chest pain  EXAM: PORTABLE CHEST - 1 VIEW  COMPARISON:   March 10, 2015  FINDINGS: There is a mass with postobstructive pneumonitis involving much of the left upper lobe. Lungs elsewhere are clear. Heart size and pulmonary vascular normal. This mass extends into the region of the left hilum with stable fullness in the left hilum. No pneumothorax. No bone lesions. Patient is status post coronary artery bypass grafting.  IMPRESSION: No pneumothorax. Persistent mass with volume loss left upper lobe. No new opacity. No change in cardiac silhouette.   Electronically Signed   By: Lowella Grip III M.D.   On: 03/12/2015 13:18   Mr Mrv Head Wo Cm  03/26/2015   CLINICAL DATA:  Initial evaluation status post left suboccipital craniotomy for resection of left cerebellar metastasis.  EXAM: MR MRV HEAD WITHOUT CONTRAST  TECHNIQUE: Multiplanar, multisequence MR imaging was performed. No intravenous contrast was administered.  COMPARISON:  Prior brain MRI 03/22/2015  FINDINGS: Dedicated MRV images demonstrate normal flow related opacification of the superior sagittal, transverse, and sigmoid sinuses. Normal flow seen within the proximal internal jugular veins. Deep venous structures are patent. No evidence for venous sinus thrombosis. No complication about the dural sinuses status post recent surgery.  Postoperative changes from recent tumor resection partially visualized within the left cerebellar hemisphere. No other definite intracranial abnormality.  IMPRESSION: Normal MRV of the brain status post recent left suboccipital craniotomy for resection of left cerebellar metastasis. No evidence for dural sinus thrombosis or other acute abnormality.   Electronically Signed   By: Jeannine Boga M.D.   On: 03/26/2015 23:52     Assessment/Plan   ICD-9-CM ICD-10-CM   1. Weakness generalized - multifactorial 780.79 R53.1   2. Hypotension, unspecified hypotension type - multifactorial 458.9 I95.9   3. 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 Severe protein-calorie malnutrition 262 E43   6. History of bone cancer V10.81 Z85.830   7. History of bladder cancer V10.51 Z85.51   8. S/P CABG x 5 V45.81 Z95.1   9. Tobacco abuse 305.1 Z72.0     --change alprazolam 0.25mg TID and qHS ATC  --start roxanol 5mg  po q2hrs prn difficulty breathing, agitation or pain  --consult palliative care  --STOP losartan due to hypotension  --STOP decadron per  family request as pt is increasingly agitated on med  --f/u with specialists as scheduled  --add nicotine patch 21 mg/hr due to current tobaco abuse  --GOAL: Comfort measures only per family request. Communicated with nursing.  Trudi Morgenthaler S. Perlie Gold  [facility] [address] [place], [place] [address] [phone] Cell (Monday-Friday 8 AM - 5 PM) [phone] After 5 PM and follow prompts

## 2015-03-30 NOTE — Telephone Encounter (Signed)
returned call to [employer] the social worker at [place] and confirmed pt appts

## 2015-04-02 ENCOUNTER — Other Ambulatory Visit: Payer: Self-pay | Admitting: *Deleted

## 2015-04-02 MED ORDER — HYDROCODONE-ACETAMINOPHEN 5-325 MG PO TABS: ORAL_TABLET | ORAL | Status: AC

## 2015-04-02 NOTE — Telephone Encounter (Signed)
[facility] 

## 2015-04-05 ENCOUNTER — Encounter (HOSPITAL_COMMUNITY): Payer: Self-pay

## 2015-04-08 ENCOUNTER — Ambulatory Visit: Payer: Medicare HMO | Admitting: Internal Medicine

## 2015-04-08 ENCOUNTER — Other Ambulatory Visit: Payer: Medicare HMO

## 2015-04-26 ENCOUNTER — Ambulatory Visit
Admit: 2015-04-26 | Discharge: 2015-04-26 | Disposition: A | Discharge status: Home / Self Care | Payer: Medicare HMO | Attending: Radiation Oncology | Admitting: Radiation Oncology

## 2015-05-10 DEATH — deceased

## 2015-05-21 ENCOUNTER — Encounter (HOSPITAL_COMMUNITY): Payer: Self-pay

## 2015-05-26 ENCOUNTER — Encounter (HOSPITAL_COMMUNITY): Payer: Self-pay

## 2016-02-08 IMAGING — MR MR HEAD WO/W CM
7 of 11 series · 30 of 48 positions shown · IV contrast (multihance)
Comparison: PET CT same date.  No comparison brain MR or head CT.

CLINICAL DATA: 53-year-old hypertensive male with history of
bladder cancer, bone cancer and recent diagnosis of lung cancer
presenting with dizziness and weakness. Subsequent encounter.

EXAM:
MRI HEAD WITHOUT AND WITH CONTRAST
TECHNIQUE: Multiplanar, multiecho pulse sequences of the brain and surrounding
structures were obtained without and with intravenous contrast.
CONTRAST:  10mL MULTIHANCE GADOBENATE DIMEGLUMINE 529 MG/ML IV SOLN

[Series 3: FLAIR · sagittal · 5.0mm · 0.47mm/px · 3 of 23 slices shown (1 of 3)]
[im 1/23]
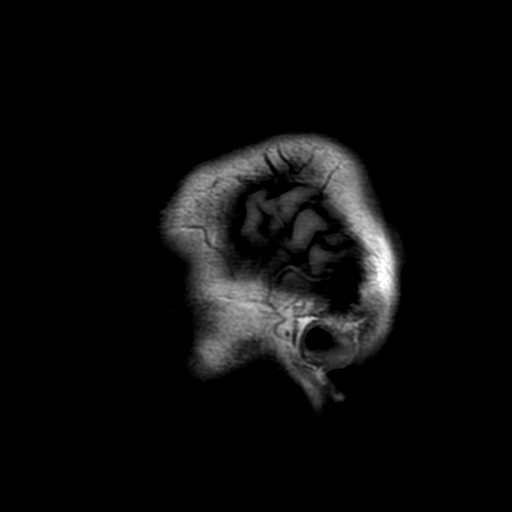
[im 12/23]
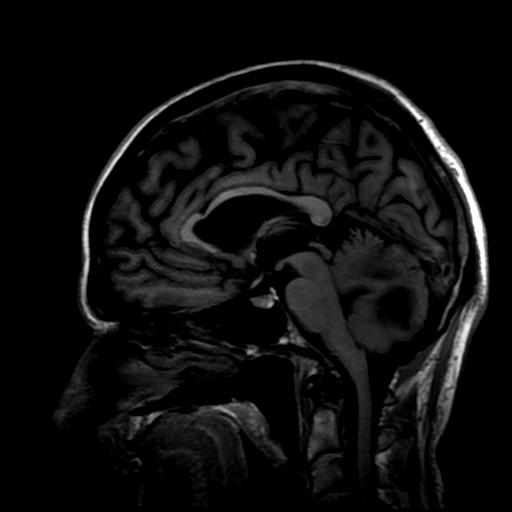
[im 23/23]
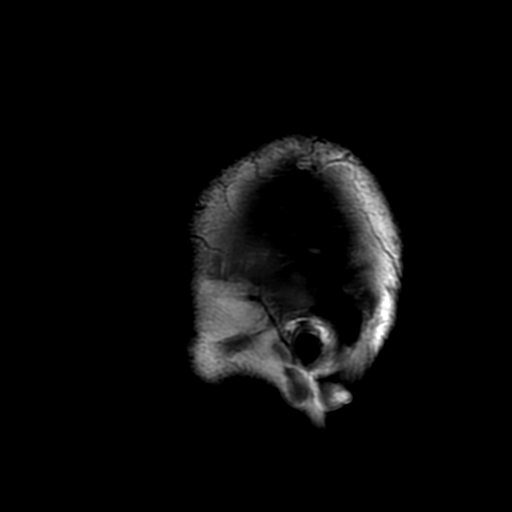

[Series 5: DWI · axial · 3.0mm · 0.94mm/px · z∈[-57,+77]mm · 11 of 94 slices shown (1 of 2)]
[im 1/94]
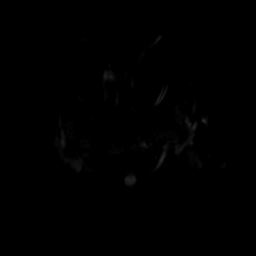
[im 10/94]
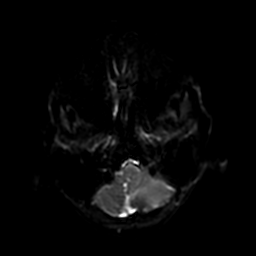
[im 19/94]
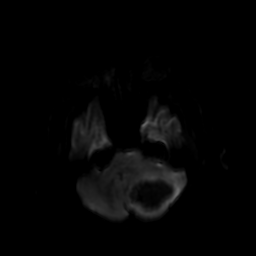
[im 28/94]
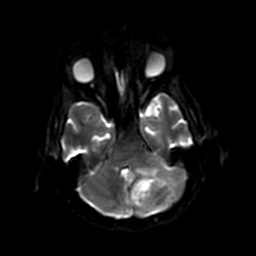
[im 38/94]
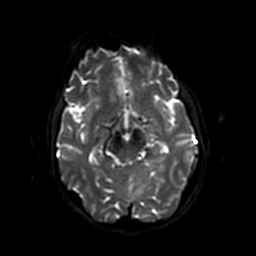
[im 47/94]
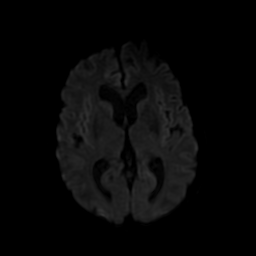
[im 56/94]
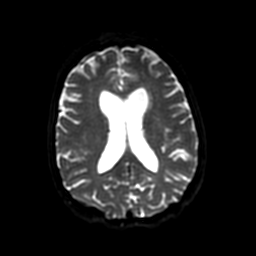
[im 66/94]
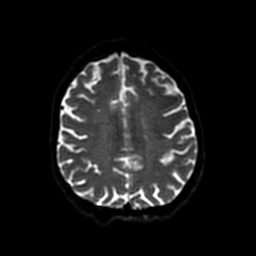
[im 75/94]
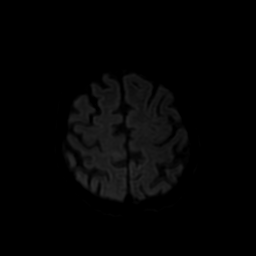
[im 84/94]
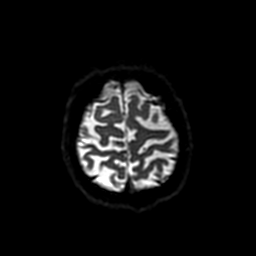
[im 94/94]
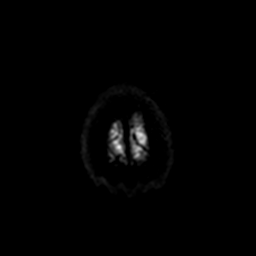

[Series 6: T2 · axial · 5.0mm · 0.43mm/px · z∈[-53,+81]mm · 3 of 24 slices shown (1 of 2)]
[im 1/24]
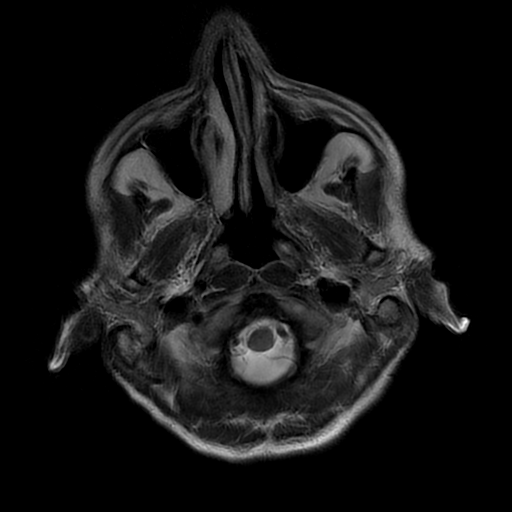
[im 12/24]
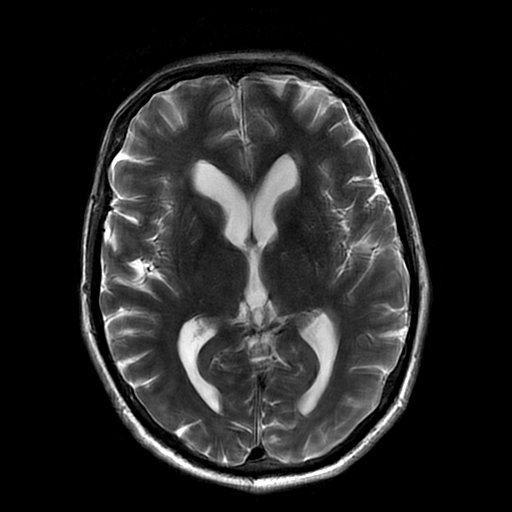
[im 24/24]
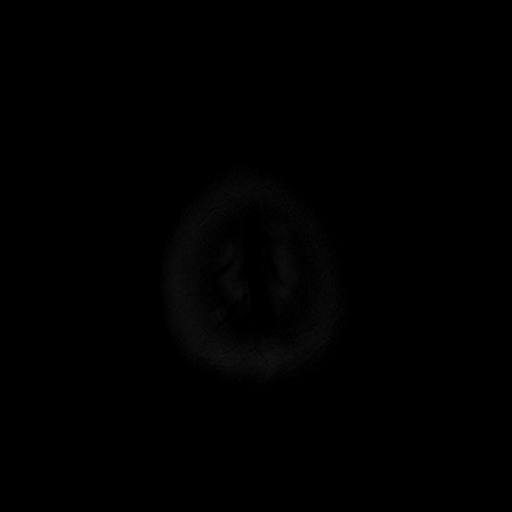

[Series 7: FLAIR · axial · 5.0mm · 0.43mm/px · z∈[-53,+81]mm · 3 of 24 slices shown (2 of 3)]
[im 1/24]
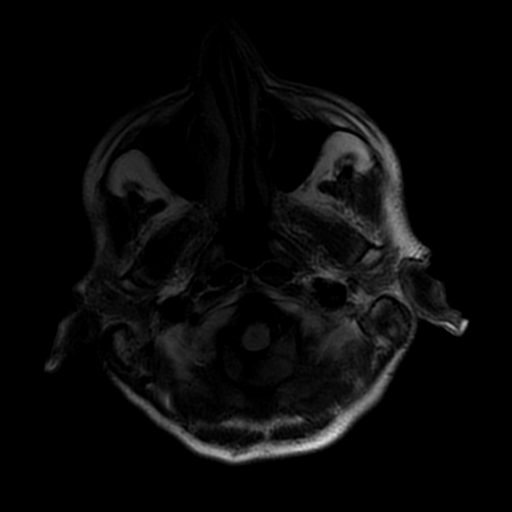
[im 12/24]
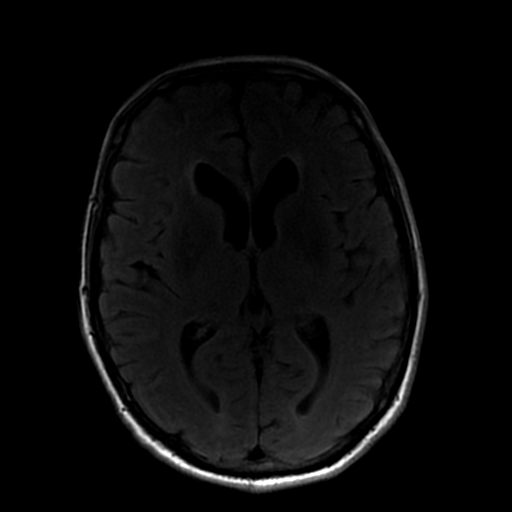
[im 24/24]
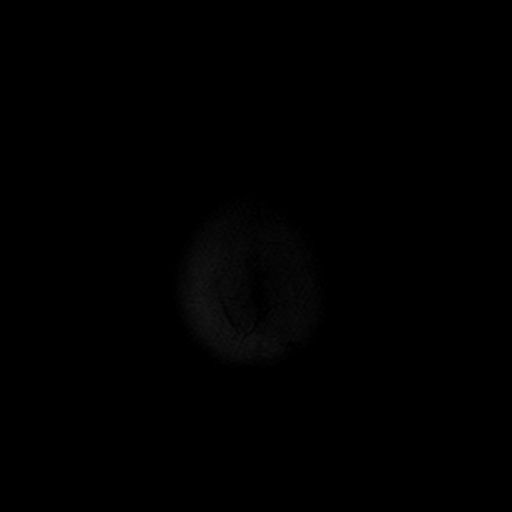

[Series 11: T2 · coronal · 5.0mm · 0.39mm/px · 2 of 30 slices shown (2 of 2)]
[im 1/30]
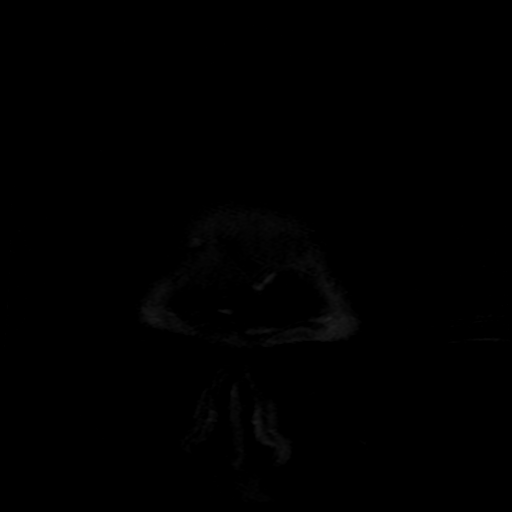
[im 15/30]
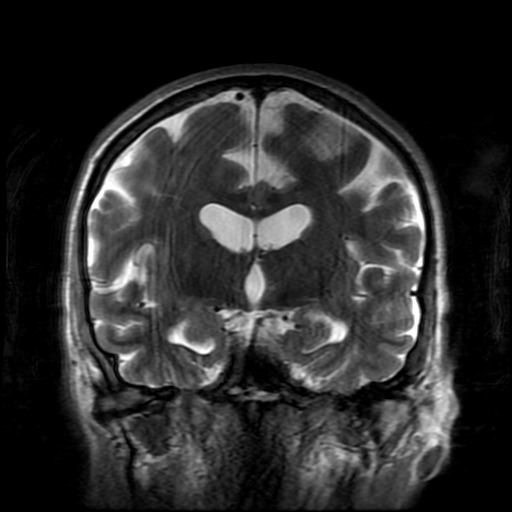

[Series 14: FLAIR · sagittal · 5.0mm · 0.47mm/px · 3 of 23 slices shown (3 of 3)]
[im 1/23]
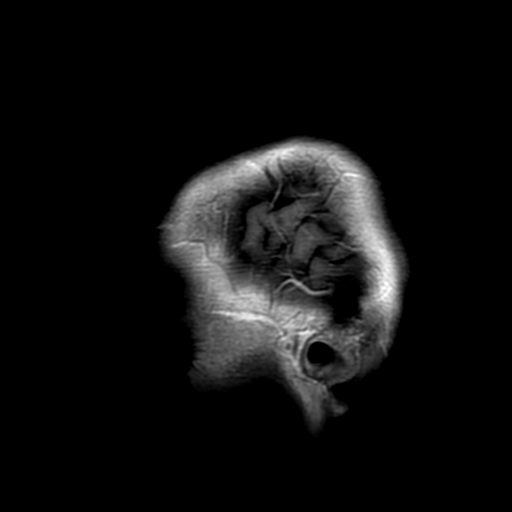
[im 12/23]
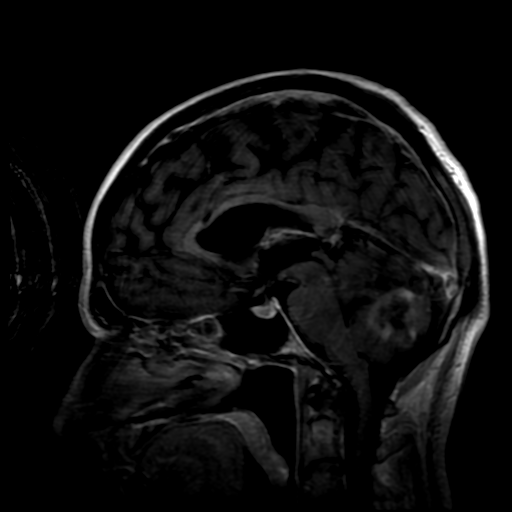
[im 23/23]
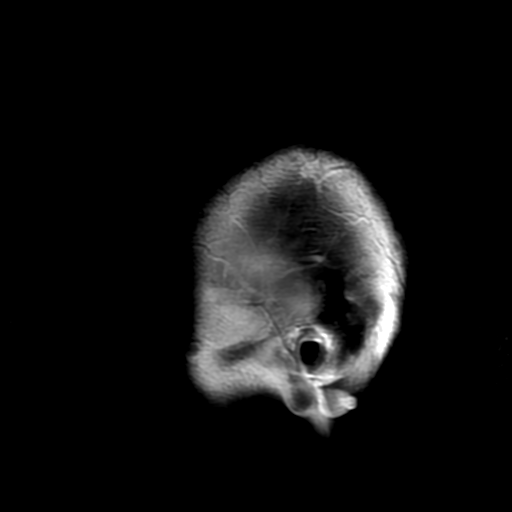

[Series 500: DWI · axial · 3.0mm · 0.94mm/px · z∈[-57,+77]mm · 5 of 47 slices shown (2 of 2)]
[im 1/47]
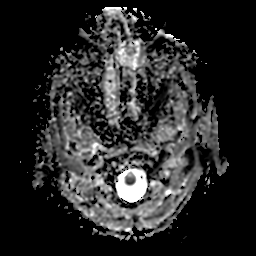
[im 12/47]
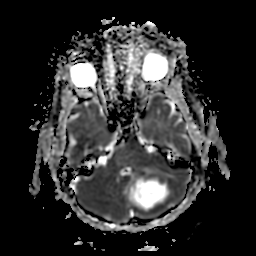
[im 24/47]
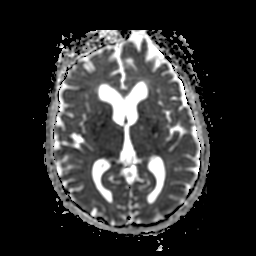
[im 35/47]
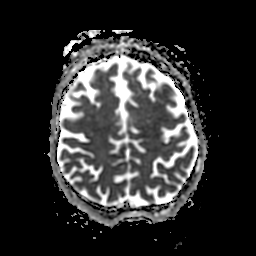
[im 47/47]
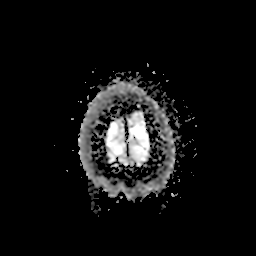

[30 of 48 positions shown; findings below may reference images not displayed]

FINDINGS: Exam is motion degraded (patient was claustrophobic and fast
technique imaging had to be utilized).

No acute infarct.

Left cerebellar 5 x 4.4 x 3.5 cm mass with calcification/hemorrhage
and partial necrosis with surrounding vasogenic edema is causing
significant compression of the fourth ventricle, left aspect of the
medulla, left middle cerebellar peduncle and left superior
cerebellar peduncle. Minimal downward displacement of the left
cerebellar peduncle. Slight ventricular prominence without
significant hydrocephalus although the patient is at risk for
development of such.

Findings highly concerning for solitary metastatic lesion with
primary brain tumor a secondary less likely consideration.

Left lenticular nucleus/ mid corona radiata developmental venous
anomaly incidentally noted.

Major intracranial vascular structures are patent.

Minimal partial opacification inferior right mastoid air cells.
Minimal mucosal thickening paranasal sinuses.

Orbital structures unremarkable.
IMPRESSION: Large left cerebellar lesion suspicious for metastatic disease with
surrounding vasogenic edema and significant mass effect placing
patient at high risk for development of hydrocephalus. Please see
above.

These results were called by telephone at the time of interpretation
on 03/18/2015 at [DATE] to Dr. DEISY PAOLA EPULEF , who verbally
acknowledged these results.
